# Patient Record
Sex: Female | Born: 1970 | Race: White | Hispanic: No | Marital: Married | Smoking: Former smoker
Health system: Southern US, Community
[De-identification: ages and names within clinical notes are randomized; demographics above are authoritative.]

## PROBLEM LIST (undated history)

## (undated) ENCOUNTER — Inpatient Hospital Stay (HOSPITAL_COMMUNITY): Payer: BC Managed Care – PPO

## (undated) DIAGNOSIS — M1990 Unspecified osteoarthritis, unspecified site: Secondary | ICD-10-CM

## (undated) DIAGNOSIS — R519 Headache, unspecified: Secondary | ICD-10-CM

## (undated) DIAGNOSIS — J45909 Unspecified asthma, uncomplicated: Secondary | ICD-10-CM

## (undated) DIAGNOSIS — Z9889 Other specified postprocedural states: Secondary | ICD-10-CM

## (undated) DIAGNOSIS — R51 Headache: Secondary | ICD-10-CM

## (undated) DIAGNOSIS — T7840XA Allergy, unspecified, initial encounter: Secondary | ICD-10-CM

## (undated) DIAGNOSIS — K219 Gastro-esophageal reflux disease without esophagitis: Secondary | ICD-10-CM

## (undated) DIAGNOSIS — F419 Anxiety disorder, unspecified: Secondary | ICD-10-CM

## (undated) DIAGNOSIS — R112 Nausea with vomiting, unspecified: Secondary | ICD-10-CM

## (undated) HISTORY — PX: CHOLECYSTECTOMY: SHX55

## (undated) HISTORY — PX: OTHER SURGICAL HISTORY: SHX169

## (undated) HISTORY — DX: Allergy, unspecified, initial encounter: T78.40XA

## (undated) HISTORY — DX: Anxiety disorder, unspecified: F41.9

## (undated) HISTORY — PX: KNEE SURGERY: SHX244

## (undated) HISTORY — PX: NOSE SURGERY: SHX723

## (undated) HISTORY — DX: Unspecified osteoarthritis, unspecified site: M19.90

---

## 2010-07-21 ENCOUNTER — Other Ambulatory Visit: Payer: Self-pay | Admitting: Family Medicine

## 2010-07-21 DIAGNOSIS — Z1231 Encounter for screening mammogram for malignant neoplasm of breast: Secondary | ICD-10-CM

## 2010-07-27 ENCOUNTER — Ambulatory Visit
Admission: RE | Admit: 2010-07-27 | Discharge: 2010-07-27 | Disposition: A | Discharge status: Home / Self Care | Payer: Self-pay | Source: Ambulatory Visit | Attending: Family Medicine | Admitting: Family Medicine

## 2010-07-27 DIAGNOSIS — Z1231 Encounter for screening mammogram for malignant neoplasm of breast: Secondary | ICD-10-CM

## 2011-10-05 ENCOUNTER — Ambulatory Visit: Payer: Self-pay | Admitting: Family Medicine

## 2011-10-05 ENCOUNTER — Telehealth: Payer: Self-pay

## 2011-10-05 VITALS — BP 106/71 | HR 76 | Temp 97.7°F | Resp 16 | Ht 67.5 in | Wt 145.0 lb

## 2011-10-05 DIAGNOSIS — E860 Dehydration: Secondary | ICD-10-CM

## 2011-10-05 DIAGNOSIS — R112 Nausea with vomiting, unspecified: Secondary | ICD-10-CM

## 2011-10-05 DIAGNOSIS — R51 Headache: Secondary | ICD-10-CM

## 2011-10-05 DIAGNOSIS — K529 Noninfective gastroenteritis and colitis, unspecified: Secondary | ICD-10-CM

## 2011-10-05 DIAGNOSIS — R509 Fever, unspecified: Secondary | ICD-10-CM

## 2011-10-05 LAB — POCT INFLUENZA A/B
Influenza A, POC: NEGATIVE
Influenza B, POC: NEGATIVE

## 2011-10-05 MED ORDER — ONDANSETRON 4 MG PO TBDP
4.0000 mg | ORAL_TABLET | Freq: Once | ORAL | Status: AC
  Administered 2011-10-05: 4 mg via ORAL

## 2011-10-05 MED ORDER — PROMETHAZINE HCL 25 MG PO TABS: 25.0000 mg | ORAL_TABLET | Freq: Three times a day (TID) | ORAL | Status: DC | PRN

## 2011-10-05 NOTE — Telephone Encounter (Signed)
Advised pt per Marylene Land that she can either take Excedrin or Tylenol

## 2011-10-05 NOTE — Progress Notes (Signed)
[facility]:  Office Visit  Chief Complaint:  Chief Complaint  Patient presents with  . Emesis    yesterday  . Headache  . Generalized Body Aches    HPI: Natalie Yang is a 41 y.o. female who complains of a 1 day h/o msk aches, pain, fever Tmax 100.7, chills, N/V abd pain. Emesis x 8  And wretching with  mild epigastric abd pain. Poor PO intake. Has not eaten anything today. Tried Tylenol with minimal relief. + HA. N/V not associated with food, CP, SOB.  Patient has not tried any new meds, or new travels. She did have these sxs about 12-15 hrs after eating at a salad bar. She has had some minimal diarrhea with the N/V. HEr husband had mild diarrhea but not severe as hers. Patient is also training for a triathlon and has been feeling "not herself" after training but the N/V and generalized fatigue has not been this severe.   History reviewed. No pertinent past medical history. Past Surgical History  Procedure Date  . Knee surgery   . Cholecystectomy    History   Social History  . Marital Status: Single    Spouse Name: N/A    Number of Children: N/A  . Years of Education: N/A   Social History Main Topics  . Smoking status: Former Games developer  . Smokeless tobacco: None  . Alcohol Use: Yes  . Drug Use: No  . Sexually Active: None   Other Topics Concern  . None   Social History Narrative  . None   No family history on file. Allergies  Allergen Reactions  . Codeine Itching and Nausea And Vomiting   Prior to Admission medications   Medication Sig Start Date End Date Taking? Authorizing Provider  butalbital-acetaminophen-caffeine (FIORICET, ESGIC) 50-325-40 MG per tablet Take 1 tablet by mouth 2 (two) times daily as needed.   Yes Historical Provider, MD     ROS: The patient denies fevers, chills, night sweats, unintentional weight loss, chest pain, palpitations, wheezing, dyspnea on exertion, nausea, vomiting, abdominal pain, dysuria, hematuria,  melena, numbness, weakness, or tingling. + nausea/vomiting/mid epigastric pain  All other systems have been reviewed and were otherwise negative with the exception of those mentioned in the HPI and as above.    PHYSICAL EXAM: Filed Vitals:   10/05/11 1226  BP: 106/71  Pulse: 76  Temp: 97.7 F (36.5 C)  Resp: 16   Filed Vitals:   10/05/11 1226  Height: 5' 7.5" (1.715 m)  Weight: 145 lb (65.772 kg)   Body mass index is 22.38 kg/(m^2).  General: Alert, no acute distress, tired appearing HEENT:  Normocephalic, atraumatic, oropharynx patent. TM normal,dry oral mucosa, PERRLA, EOMI Cardiovascular:  Regular rate and rhythm, no rubs murmurs or gallops.  No Carotid bruits, radial pulse intact. No pedal edema.  Respiratory: Clear to auscultation bilaterally.  No wheezes, rales, or rhonchi.  No cyanosis, no use of accessory musculature GI: No organomegaly, abdomen is soft and non-tender, positive bowel sounds.  No masses. Skin: No rashes. Neurologic: Facial musculature symmetric. Psychiatric: Patient is appropriate throughout our interaction. Lymphatic: No cervical lymphadenopathy Musculoskeletal: Gait intact.   LABS: Results for orders placed in visit on 10/05/11  POCT INFLUENZA A/B      Component Value Range   Influenza A, POC Negative     Influenza B, POC Negative       EKG/XRAY:   Primary read interpreted by Dr. Conley Rolls at [facility].   ASSESSMENT/PLAN: Encounter Diagnoses  Name Primary?  . Nausea & vomiting Yes  . Dehydration   . Fever   . Headache   . Gastroenteritis    Patient is uninsured She declined getting more labs or imaging due to cost Flu was negative Patient received IVF and felt better Work note given    Rockne Coons, DO 10/05/2011 2:07 PM

## 2011-10-05 NOTE — Telephone Encounter (Signed)
PATIENT WAS SEEN TODAY BY [facility].  SHE HAS A FLU-LIKE VIRUS AND IS NOW HAVING A MAJOR HEADACHE.  WANTS TO KNOW IF SHE SHOULD TAKE EXCEDRIN OR HER OTHER MEDICATION.  PLEASE CALL

## 2012-10-09 ENCOUNTER — Other Ambulatory Visit: Payer: Self-pay

## 2012-10-09 DIAGNOSIS — Z1231 Encounter for screening mammogram for malignant neoplasm of breast: Secondary | ICD-10-CM

## 2012-10-24 ENCOUNTER — Ambulatory Visit
Admission: RE | Admit: 2012-10-24 | Discharge: 2012-10-24 | Disposition: A | Discharge status: Home / Self Care | Payer: No Typology Code available for payment source | Source: Ambulatory Visit

## 2012-10-24 DIAGNOSIS — Z1231 Encounter for screening mammogram for malignant neoplasm of breast: Secondary | ICD-10-CM

## 2013-09-25 ENCOUNTER — Other Ambulatory Visit: Payer: Self-pay

## 2013-09-25 DIAGNOSIS — Z1231 Encounter for screening mammogram for malignant neoplasm of breast: Secondary | ICD-10-CM

## 2013-10-26 ENCOUNTER — Ambulatory Visit
Admission: RE | Admit: 2013-10-26 | Discharge: 2013-10-26 | Disposition: A | Discharge status: Home / Self Care | Payer: BC Managed Care – PPO | Source: Ambulatory Visit

## 2013-10-26 ENCOUNTER — Encounter (INDEPENDENT_AMBULATORY_CARE_PROVIDER_SITE_OTHER): Payer: Self-pay

## 2013-10-26 DIAGNOSIS — Z1231 Encounter for screening mammogram for malignant neoplasm of breast: Secondary | ICD-10-CM

## 2013-11-01 ENCOUNTER — Ambulatory Visit (INDEPENDENT_AMBULATORY_CARE_PROVIDER_SITE_OTHER): Payer: BC Managed Care – PPO | Admitting: Internal Medicine

## 2013-11-01 VITALS — BP 112/74 | HR 86 | Temp 98.7°F | Resp 16 | Ht 67.0 in | Wt 143.2 lb

## 2013-11-01 DIAGNOSIS — G43909 Migraine, unspecified, not intractable, without status migrainosus: Secondary | ICD-10-CM

## 2013-11-01 DIAGNOSIS — R1110 Vomiting, unspecified: Secondary | ICD-10-CM

## 2013-11-01 DIAGNOSIS — Z8669 Personal history of other diseases of the nervous system and sense organs: Secondary | ICD-10-CM

## 2013-11-01 DIAGNOSIS — E860 Dehydration: Secondary | ICD-10-CM

## 2013-11-01 MED ORDER — KETOROLAC TROMETHAMINE 60 MG/2ML IM SOLN
60.0000 mg | Freq: Once | INTRAMUSCULAR | Status: AC
  Administered 2013-11-01: 60 mg via INTRAMUSCULAR

## 2013-11-01 MED ORDER — ONDANSETRON 4 MG PO TBDP
8.0000 mg | ORAL_TABLET | Freq: Once | ORAL | Status: AC
Start: 2013-11-01 — End: 2013-11-01
  Administered 2013-11-01: 8 mg via ORAL

## 2013-11-01 MED ORDER — ONDANSETRON HCL 8 MG PO TABS: 8.0000 mg | ORAL_TABLET | Freq: Three times a day (TID) | ORAL | Status: DC | PRN

## 2013-11-01 MED ORDER — CYCLOBENZAPRINE HCL 10 MG PO TABS: 10.0000 mg | ORAL_TABLET | Freq: Three times a day (TID) | ORAL | Status: DC | PRN

## 2013-11-01 NOTE — Progress Notes (Signed)
   Subjective:    Patient ID: Regis Billracey Zerbe, female    DOB: 10/10/1970, 43 y.o.   MRN: [id number]  HPI Vomiting, Headache worsening X yesterday morning body aches, Diarrhea Hx of asthma, use an albuterol inhaler Migraines, has sumatryptan and did not work this time No abdominal pain Loss of appetite Took 1 phenergan yesterday, did not help with vomiting.  No focal NMS loss zofran odt 8mg  po now Dehydrated and lite headed. Has had IV fluids here before with success No exposure hx for tick fever Review of Systems Hx neg brain scan in past from [facility]   Had normal cpe and all tests this year from primary care Objective:   Physical Exam  Constitutional: She is oriented to person, place, and time. Vital signs are normal. She appears well-developed and well-nourished. She is cooperative. She has a sickly appearance.  HENT:  Head: Normocephalic.  Right Ear: External ear normal.  Left Ear: External ear normal.  Nose: Nose normal.  Eyes: EOM are normal. Pupils are equal, round, and reactive to light. No scleral icterus.  Neck: Normal range of motion. Neck supple.  Cardiovascular: Normal rate.   Pulmonary/Chest: Effort normal.  Neurological: She is alert and oriented to person, place, and time. She has normal reflexes. She displays normal reflexes. No cranial nerve deficit or sensory deficit. She exhibits normal muscle tone. Coordination normal. She displays no Babinski's sign on the right side. She displays no Babinski's sign on the left side.  Skin: No rash noted.  Psychiatric: She has a normal mood and affect. Her behavior is normal. Judgment and thought content normal.    IV rehydration Toradol 60mg  IM     Assessment & Plan:  Migrain Zofran 8mg /Flexeril 10mg  to sleep/Hydrate Scan prn no improvement/See family doc

## 2013-11-01 NOTE — Patient Instructions (Addendum)
Migraine Headache A migraine headache is an intense, throbbing pain on one or both sides of your head. A migraine can last for 30 minutes to several hours. CAUSES  The exact cause of a migraine headache is not always known. However, a migraine may be caused when nerves in the brain become irritated and release chemicals that cause inflammation. This causes pain. Certain things may also trigger migraines, such as:  Alcohol.  Smoking.  Stress.  Menstruation.  Aged cheeses.  Foods or drinks that contain nitrates, glutamate, aspartame, or tyramine.  Lack of sleep.  Chocolate.  Caffeine.  Hunger.  Physical exertion.  Fatigue.  Medicines used to treat chest pain (nitroglycerine), birth control pills, estrogen, and some blood pressure medicines. SIGNS AND SYMPTOMS  Pain on one or both sides of your head.  Pulsating or throbbing pain.  Severe pain that prevents daily activities.  Pain that is aggravated by any physical activity.  Nausea, vomiting, or both.  Dizziness.  Pain with exposure to bright lights, loud noises, or activity.  General sensitivity to bright lights, loud noises, or smells. Before you get a migraine, you may get warning signs that a migraine is coming (aura). An aura may include:  Seeing flashing lights.  Seeing bright spots, halos, or zig-zag lines.  Having tunnel vision or blurred vision.  Having feelings of numbness or tingling.  Having trouble talking.  Having muscle weakness. DIAGNOSIS  A migraine headache is often diagnosed based on:  Symptoms.  Physical exam.  A CT scan or MRI of your head. These imaging tests cannot diagnose migraines, but they can help rule out other causes of headaches. TREATMENT Medicines may be given for pain and nausea. Medicines can also be given to help prevent recurrent migraines.  HOME CARE INSTRUCTIONS  Only take over-the-counter or prescription medicines for pain or discomfort as directed by your  health care provider. The use of long-term narcotics is not recommended.  Lie down in a dark, quiet room when you have a migraine.  Keep a journal to find out what may trigger your migraine headaches. For example, write down:  What you eat and drink.  How much sleep you get.  Any change to your diet or medicines.  Limit alcohol consumption.  Quit smoking if you smoke.  Get 7 9 hours of sleep, or as recommended by your health care provider.  Limit stress.  Keep lights dim if bright lights bother you and make your migraines worse. SEEK IMMEDIATE MEDICAL CARE IF:   Your migraine becomes severe.  You have a fever.  You have a stiff neck.  You have vision loss.  You have muscular weakness or loss of muscle control.  You start losing your balance or have trouble walking.  You feel faint or pass out.  You have severe symptoms that are different from your first symptoms. MAKE SURE YOU:   Understand these instructions.  Will watch your condition.  Will get help right away if you are not doing well or get worse. Document Released: 05/31/2005 Document Revised: 03/21/2013 Document Reviewed: 02/05/2013 [facility] Patient Information 2014 [place], [place]. [place] Spotted Fever [place] Spotted Fever (RMSF) is the oldest known tick-borne disease of people in the [place]. This disease was named because it was first described among people in the [facility] area who had an illness characterized by a rash with red-purple-black spots. This disease is caused by a rickettsia (Rickettsia rickettsii), a bacteria carried by the tick. The [facility] wood tick  and the American dog tick, acquire and transmit the RMSF bacteria (pictures NOT actual size). When a larval, nymphal or adult tick feeds on an infected rodent or larger animal, the tick can become infected. Infected adult ticks then feed on people who may then get RMSF. The tick transmits the disease to humans  during a prolonged period of feeding that lasts many hours, days or even a couple weeks. The bite is painless and frequently goes unnoticed. An infected female tick may also pass the rickettsial bacteria to her eggs that then may mature to be infected adult ticks. The rickettsia that causes RMSF can also get into a person's body through damaged skin. A tick bite is not necessary. People can get RMSF if they crush a tick and get it's blood or body fluids on their skin through a small cut or sore.  DIAGNOSIS Diagnosis is made by laboratory tests.  TREATMENT Treatment is with antibiotics (medications that kill rickettsia and other bacteria). Immediate treatment usually prevents death. GEOGRAPHIC RANGE This disease was reported only in the [place] until 1931. RMSF has more recently been described among individuals in all states except [place], [place] and [place]. The highest reported incidences of RMSF now occur among residents of [place], [place], [place] and [address] [place]. TIME OF YEAR  Most cases are diagnosed during late spring and summer when ticks are most active. However, especially in the warmer [place], a few cases occur during the winter. SYMPTOMS   Symptoms of RMSF begin from 2 to 14 days after a tick bite. The most common early symptoms are fever, muscle aches and headache followed by nausea (feeling sick to your stomach) or vomiting.  The RMSF rash is typically delayed until 3 or more days after symptom onset, and eventually develops in 9 of 10 infected patients by the 5th day of illness. If the disease is not treated it can cause death. If you get a fever, headache, muscle aches, rash, nausea or vomiting within 2 weeks of a possible tick bite or exposure you should see your caregiver immediately. PREVENTION Ticks prefer to hide in shady, moist ground litter. They can often be found above the ground clinging to tall grass, brush, shrubs and low tree branches. They also  inhabit lawns and gardens, especially at the edges of woodlands and around old stone walls. Within the areas where ticks generally live, no naturally vegetated area can be considered completely free of infected ticks. The best precaution against RMSF is to avoid contact with soil, leaf litter and vegetation as much as possible in tick infested areas. For those who enjoy gardening or walking in their yards, clear brush and mow tall grass around houses and at the edges of gardens. This may help reduce the tick population in the immediate area. Applications of chemical insecticides by a licensed professional in the spring (late May) and Fall (September) will also control ticks, especially in heavily infested areas. Treatment will never get rid of all the ticks. Getting rid of small animal populations that host ticks will also decrease the tick population. When working in the garden, Mattelpruning shrubs, or handling soil and vegetation, wear light-colored protective clothing and gloves. Spot-check often to prevent ticks from reaching the skin. Ticks cannot jump or fly. They will not drop from an above-ground perch onto a passing animal. Once a tick gains access to human skin it climbs upward until it reaches a more protected area. For example, the back of the knee, groin, navel, armpit,  ears or nape of the neck. It then begins the slow process of embedding itself in the skin. Campers, hikers, field workers, and others who spend time in wooded, brushy or tall grassy areas can avoid exposure to ticks by using the following precautions:  Wear light-colored clothing with a tight weave to spot ticks more easily and prevent contact with the skin.  Wear long pants tucked into socks, long-sleeved shirts tucked into pants and enclosed shoes or boots along with insect repellent.  Spray clothes with insect repellent containing either DEET or Permethrin. Only DEET can be used on exposed skin. Follow the manufacturer's directions  carefully.  Wear a hat and keep long hair pulled back.  Stay on cleared, well-worn trails whenever possible.  Spot-check yourself and others often for the presence of ticks on clothes. If you find one, there are likely to be others. Check thoroughly.  Remove clothes after leaving tick-infested areas. If possible, wash them to eliminate any unseen ticks. Check yourself, your children and any pets from head to toe for the presence of ticks.  Shower and shampoo. You can greatly reduce your chances of contracting RMSF if you remove attached ticks as soon as possible. Regular checks of the body, including all body sites covered by hair (head, armpits, genitals), allow removal of the tick before rickettsial transmission. To remove an attached tick, use a forceps or tweezers to detach the intact tick without leaving mouth parts in the skin. The tick bite wound should be cleansed after tick removal. Remember the most common symptoms of RMSF are fever, muscle aches, headache and nausea or vomiting with a later onset of rash. If you get these symptoms after a tick bite and while living in an area where RMSF is found, RMSF should be suspected. If the disease is not treated, it can cause death. See your caregiver immediately if you get these symptoms. Do this even if not aware of a tick bite. Document Released: 09/12/2000 Document Revised: 08/23/2011 Document Reviewed: 05/05/2009 [facility] Patient Information 2014 [place].

## 2014-08-23 ENCOUNTER — Encounter (HOSPITAL_COMMUNITY): Payer: Self-pay

## 2014-08-23 ENCOUNTER — Encounter (HOSPITAL_COMMUNITY)
Admission: RE | Admit: 2014-08-23 | Discharge: 2014-08-23 | Disposition: A | Discharge status: Home / Self Care | Payer: BLUE CROSS/BLUE SHIELD | Source: Ambulatory Visit | Attending: Obstetrics and Gynecology | Admitting: Obstetrics and Gynecology

## 2014-08-23 DIAGNOSIS — N393 Stress incontinence (female) (male): Secondary | ICD-10-CM | POA: Insufficient documentation

## 2014-08-23 DIAGNOSIS — Z975 Presence of (intrauterine) contraceptive device: Secondary | ICD-10-CM | POA: Diagnosis not present

## 2014-08-23 DIAGNOSIS — Z01812 Encounter for preprocedural laboratory examination: Secondary | ICD-10-CM | POA: Diagnosis not present

## 2014-08-23 HISTORY — DX: Headache: R51

## 2014-08-23 HISTORY — DX: Headache, unspecified: R51.9

## 2014-08-23 HISTORY — DX: Unspecified asthma, uncomplicated: J45.909

## 2014-08-23 HISTORY — DX: Gastro-esophageal reflux disease without esophagitis: K21.9

## 2014-08-23 HISTORY — DX: Other specified postprocedural states: Z98.890

## 2014-08-23 HISTORY — DX: Other specified postprocedural states: R11.2

## 2014-08-23 LAB — CBC
HEMATOCRIT: 40.4 % (ref 36.0–46.0)
HEMOGLOBIN: 13.2 g/dL (ref 12.0–15.0)
MCH: 32.4 pg (ref 26.0–34.0)
MCHC: 32.7 g/dL (ref 30.0–36.0)
MCV: 99.3 fL (ref 78.0–100.0)
Platelets: 278 10*3/uL (ref 150–400)
RBC: 4.07 MIL/uL (ref 3.87–5.11)
RDW: 12.9 % (ref 11.5–15.5)
WBC: 4.7 10*3/uL (ref 4.0–10.5)

## 2014-08-23 NOTE — Patient Instructions (Signed)
Your procedure is scheduled on:08/30/14  Enter through the Main Entrance at :6am Pick up desk phone and dial [phone] and inform us of your arrival.  Please call [phone] if you have any problems the morning of surgery.  Remember: Do not eat food or drink liquids, including water, after midnight:Thursday   BRING INHALER TO HOSPITAL  You may brush your teeth the morning of surgery.   DO NOT wear jewelry, eye make-up, lipstick,body lotion, or dark fingernail polish.  (Polished toes are ok) You may wear deodorant.  If you are to be admitted after surgery, leave suitcase in car until your room has been assigned. Patients discharged on the day of surgery will not be allowed to drive home. Wear loose fitting, comfortable clothes for your ride home.

## 2014-08-29 NOTE — H&P (Signed)
Natalie Yang is an 44 y.o. female.  She was seen foa an annual exam in December, was having irregular menses every 1-2 months, some mood issues-Prozac helps a little, no significant hot flashes, still has Paragard. Sexually active, slightly decreased libido-[facility]. She is having increased urine leak with strain, limits activities. She would like to get paragard out and see if that helps with back pain, interested in permanent sterility and endometrial ablation.  Normal pelvic ultrasound and benign pipelle.  Pertinent Gynecological History: Last pap: abnormal: ASCUS with neg HPV Date: 05/2014 OB History: G4, P1021 SVD at term without problems   Menstrual History: Irregular menses every 1-2 months    Past Medical History  Diagnosis Date  . PONV (postoperative nausea and vomiting)   . GERD (gastroesophageal reflux disease)   . Headache     migraines  . Asthma     Past Surgical History  Procedure Laterality Date  . Knee surgery    . Cholecystectomy    . Nose surgery      x 5    No family history on file.  Social History:  reports that she has quit smoking. She does not have any smokeless tobacco history on file. She reports that she drinks alcohol. She reports that she does not use illicit drugs.  Allergies:  Allergies  Allergen Reactions  . Codeine Itching and Nausea And Vomiting    No prescriptions prior to admission    Review of Systems  Respiratory: Negative.   Cardiovascular: Negative.   Gastrointestinal: Negative.   Genitourinary: Negative.     There were no vitals taken for this visit. Physical Exam  Constitutional: She appears well-developed and well-nourished.  Neck: Neck supple. No thyromegaly present.  Cardiovascular: Normal rate, regular rhythm and normal heart sounds.   No murmur heard. Respiratory: Effort normal and breath sounds normal. No respiratory distress. She has no wheezes.  GI: Soft. She exhibits no distension  and no mass. There is no tenderness.  Genitourinary: Vagina normal.  Uterus normal size No adnexal mass Urethra is hyermobile    No results found for this or any previous visit (from the past 24 hour(s)).  No results found.  Assessment/Plan: Desires surgical sterility and removal of Paragard, has menorrhagia and SUI.  Discussed all medical and surgical options, surgical procedures and risks including specific risks associated with mesh.  Will admit for laparoscopic bilateral salpingectomy, IUD removal, Novasure and Solyx sling.  Jamus Loving D 08/29/2014, 7:37 PM

## 2014-08-30 ENCOUNTER — Encounter (HOSPITAL_COMMUNITY)
Admission: RE | Disposition: A | Discharge status: Home / Self Care | Payer: Self-pay | Source: Ambulatory Visit | Attending: Obstetrics and Gynecology

## 2014-08-30 ENCOUNTER — Ambulatory Visit (HOSPITAL_COMMUNITY): Payer: BLUE CROSS/BLUE SHIELD | Admitting: Certified Registered Nurse Anesthetist

## 2014-08-30 ENCOUNTER — Encounter (HOSPITAL_COMMUNITY): Payer: Self-pay | Admitting: *Deleted

## 2014-08-30 ENCOUNTER — Ambulatory Visit (HOSPITAL_COMMUNITY)
Admission: RE | Admit: 2014-08-30 | Discharge: 2014-08-30 | Disposition: A | Discharge status: Home / Self Care | Payer: BLUE CROSS/BLUE SHIELD | Source: Ambulatory Visit | Attending: Obstetrics and Gynecology | Admitting: Obstetrics and Gynecology

## 2014-08-30 DIAGNOSIS — G43909 Migraine, unspecified, not intractable, without status migrainosus: Secondary | ICD-10-CM | POA: Diagnosis not present

## 2014-08-30 DIAGNOSIS — N393 Stress incontinence (female) (male): Secondary | ICD-10-CM | POA: Insufficient documentation

## 2014-08-30 DIAGNOSIS — Z30432 Encounter for removal of intrauterine contraceptive device: Secondary | ICD-10-CM | POA: Insufficient documentation

## 2014-08-30 DIAGNOSIS — N920 Excessive and frequent menstruation with regular cycle: Secondary | ICD-10-CM | POA: Diagnosis not present

## 2014-08-30 DIAGNOSIS — Z87891 Personal history of nicotine dependence: Secondary | ICD-10-CM | POA: Diagnosis not present

## 2014-08-30 DIAGNOSIS — K219 Gastro-esophageal reflux disease without esophagitis: Secondary | ICD-10-CM | POA: Insufficient documentation

## 2014-08-30 DIAGNOSIS — J45909 Unspecified asthma, uncomplicated: Secondary | ICD-10-CM | POA: Diagnosis not present

## 2014-08-30 DIAGNOSIS — Z302 Encounter for sterilization: Secondary | ICD-10-CM | POA: Diagnosis not present

## 2014-08-30 HISTORY — PX: CYSTOSCOPY: SHX5120

## 2014-08-30 HISTORY — PX: HYSTEROSCOPY WITH NOVASURE: SHX5574

## 2014-08-30 HISTORY — PX: LAPAROSCOPIC TUBAL LIGATION: SHX1937

## 2014-08-30 HISTORY — PX: BLADDER SUSPENSION: SHX72

## 2014-08-30 HISTORY — PX: IUD REMOVAL: SHX5392

## 2014-08-30 LAB — PREGNANCY, URINE: PREG TEST UR: NEGATIVE

## 2014-08-30 SURGERY — HYSTEROSCOPY WITH NOVASURE
Anesthesia: General | Site: Vagina

## 2014-08-30 MED ORDER — LIDOCAINE HCL (CARDIAC) 20 MG/ML IV SOLN
INTRAVENOUS | Status: DC | PRN
  Administered 2014-08-30: 80 mg via INTRAVENOUS

## 2014-08-30 MED ORDER — NEOSTIGMINE METHYLSULFATE 10 MG/10ML IV SOLN
INTRAVENOUS | Status: DC | PRN
  Administered 2014-08-30: 3 mg via INTRAVENOUS

## 2014-08-30 MED ORDER — BUPIVACAINE-EPINEPHRINE 0.5% -1:200000 IJ SOLN
INTRAMUSCULAR | Status: DC | PRN
  Administered 2014-08-30: 10 mL

## 2014-08-30 MED ORDER — 0.9 % SODIUM CHLORIDE (POUR BTL) OPTIME
TOPICAL | Status: DC | PRN
  Administered 2014-08-30: 1000 mL

## 2014-08-30 MED ORDER — HYDROCODONE-ACETAMINOPHEN 5-325 MG PO TABS: 1.0000 | ORAL_TABLET | Freq: Four times a day (QID) | ORAL | Status: DC | PRN

## 2014-08-30 MED ORDER — GLYCOPYRROLATE 0.2 MG/ML IJ SOLN
INTRAMUSCULAR | Status: AC
  Filled 2014-08-30: qty 1

## 2014-08-30 MED ORDER — ROCURONIUM BROMIDE 100 MG/10ML IV SOLN
INTRAVENOUS | Status: DC | PRN
  Administered 2014-08-30: 40 mg via INTRAVENOUS

## 2014-08-30 MED ORDER — MIDAZOLAM HCL 2 MG/2ML IJ SOLN
INTRAMUSCULAR | Status: AC
  Filled 2014-08-30: qty 2

## 2014-08-30 MED ORDER — BUPIVACAINE HCL (PF) 0.25 % IJ SOLN
INTRAMUSCULAR | Status: AC
  Filled 2014-08-30: qty 30

## 2014-08-30 MED ORDER — MIDAZOLAM HCL 2 MG/2ML IJ SOLN
INTRAMUSCULAR | Status: DC | PRN
  Administered 2014-08-30: 2 mg via INTRAVENOUS

## 2014-08-30 MED ORDER — ROCURONIUM BROMIDE 100 MG/10ML IV SOLN
INTRAVENOUS | Status: AC
  Filled 2014-08-30: qty 1

## 2014-08-30 MED ORDER — HYDROMORPHONE HCL 1 MG/ML IJ SOLN
INTRAMUSCULAR | Status: AC
  Administered 2014-08-30: 0.5 mg via INTRAVENOUS
  Filled 2014-08-30: qty 1

## 2014-08-30 MED ORDER — GLYCOPYRROLATE 0.2 MG/ML IJ SOLN
INTRAMUSCULAR | Status: DC | PRN
  Administered 2014-08-30: 0.1 mg via INTRAVENOUS
  Administered 2014-08-30: 0.6 mg via INTRAVENOUS

## 2014-08-30 MED ORDER — DEXAMETHASONE SODIUM PHOSPHATE 4 MG/ML IJ SOLN
INTRAMUSCULAR | Status: AC
  Filled 2014-08-30: qty 1

## 2014-08-30 MED ORDER — HYDROCODONE-ACETAMINOPHEN 5-325 MG PO TABS
ORAL_TABLET | ORAL | Status: AC
  Filled 2014-08-30: qty 1

## 2014-08-30 MED ORDER — LIDOCAINE HCL 1 % IJ SOLN
INTRAMUSCULAR | Status: AC
  Filled 2014-08-30: qty 20

## 2014-08-30 MED ORDER — ACETAMINOPHEN 160 MG/5ML PO SOLN: 975.0000 mg | Freq: Once | ORAL | Status: DC

## 2014-08-30 MED ORDER — LIDOCAINE HCL 2 % IJ SOLN
INTRAMUSCULAR | Status: DC | PRN
  Administered 2014-08-30: 16 mL

## 2014-08-30 MED ORDER — LIDOCAINE HCL 2 % IJ SOLN
INTRAMUSCULAR | Status: AC
  Filled 2014-08-30: qty 20

## 2014-08-30 MED ORDER — PROPOFOL 10 MG/ML IV BOLUS
INTRAVENOUS | Status: AC
  Filled 2014-08-30: qty 20

## 2014-08-30 MED ORDER — ESTRADIOL 0.1 MG/GM VA CREA
TOPICAL_CREAM | VAGINAL | Status: AC
  Filled 2014-08-30: qty 42.5

## 2014-08-30 MED ORDER — DEXAMETHASONE SODIUM PHOSPHATE 10 MG/ML IJ SOLN
INTRAMUSCULAR | Status: DC | PRN
  Administered 2014-08-30: 4 mg via INTRAVENOUS

## 2014-08-30 MED ORDER — HYDROCODONE-ACETAMINOPHEN 5-325 MG PO TABS
1.0000 | ORAL_TABLET | Freq: Once | ORAL | Status: AC | PRN
  Administered 2014-08-30: 1 via ORAL

## 2014-08-30 MED ORDER — LACTATED RINGERS IV SOLN
INTRAVENOUS | Status: DC
  Administered 2014-08-30 (×2): via INTRAVENOUS

## 2014-08-30 MED ORDER — KETOROLAC TROMETHAMINE 30 MG/ML IJ SOLN
INTRAMUSCULAR | Status: AC
  Filled 2014-08-30: qty 1

## 2014-08-30 MED ORDER — LACTATED RINGERS IR SOLN
Status: DC | PRN
  Administered 2014-08-30: 3000 mL

## 2014-08-30 MED ORDER — CEFAZOLIN SODIUM-DEXTROSE 2-3 GM-% IV SOLR
INTRAVENOUS | Status: DC | PRN
  Administered 2014-08-30: 2 g via INTRAVENOUS

## 2014-08-30 MED ORDER — HYDROMORPHONE HCL 1 MG/ML IJ SOLN
0.2500 mg | INTRAMUSCULAR | Status: DC | PRN
  Administered 2014-08-30 (×2): 0.5 mg via INTRAVENOUS

## 2014-08-30 MED ORDER — FENTANYL CITRATE 0.05 MG/ML IJ SOLN
INTRAMUSCULAR | Status: DC | PRN
  Administered 2014-08-30 (×2): 50 ug via INTRAVENOUS
  Administered 2014-08-30: 100 ug via INTRAVENOUS
  Administered 2014-08-30: 50 ug via INTRAVENOUS

## 2014-08-30 MED ORDER — STERILE WATER FOR IRRIGATION IR SOLN
Status: DC | PRN
  Administered 2014-08-30: 1000 mL via INTRAVESICAL

## 2014-08-30 MED ORDER — KETOROLAC TROMETHAMINE 30 MG/ML IJ SOLN
INTRAMUSCULAR | Status: DC | PRN
  Administered 2014-08-30: 30 mg via INTRAVENOUS

## 2014-08-30 MED ORDER — BUPIVACAINE-EPINEPHRINE (PF) 0.5% -1:200000 IJ SOLN
INTRAMUSCULAR | Status: AC
  Filled 2014-08-30: qty 30

## 2014-08-30 MED ORDER — NEOSTIGMINE METHYLSULFATE 10 MG/10ML IV SOLN
INTRAVENOUS | Status: AC
  Filled 2014-08-30: qty 1

## 2014-08-30 MED ORDER — ONDANSETRON HCL 4 MG/2ML IJ SOLN
INTRAMUSCULAR | Status: DC | PRN
Start: 2014-08-30 — End: 2014-08-30
  Administered 2014-08-30: 4 mg via INTRAVENOUS

## 2014-08-30 MED ORDER — CEFAZOLIN SODIUM-DEXTROSE 2-3 GM-% IV SOLR
INTRAVENOUS | Status: AC
  Filled 2014-08-30: qty 50

## 2014-08-30 MED ORDER — FENTANYL CITRATE 0.05 MG/ML IJ SOLN
INTRAMUSCULAR | Status: AC
  Filled 2014-08-30: qty 5

## 2014-08-30 MED ORDER — SCOPOLAMINE 1 MG/3DAYS TD PT72
1.0000 | MEDICATED_PATCH | Freq: Once | TRANSDERMAL | Status: DC
  Administered 2014-08-30: 1.5 mg via TRANSDERMAL

## 2014-08-30 MED ORDER — SCOPOLAMINE 1 MG/3DAYS TD PT72
MEDICATED_PATCH | TRANSDERMAL | Status: AC
  Administered 2014-08-30: 1.5 mg via TRANSDERMAL
  Filled 2014-08-30: qty 1

## 2014-08-30 MED ORDER — GLYCOPYRROLATE 0.2 MG/ML IJ SOLN
INTRAMUSCULAR | Status: AC
  Filled 2014-08-30: qty 3

## 2014-08-30 MED ORDER — ONDANSETRON HCL 4 MG/2ML IJ SOLN
INTRAMUSCULAR | Status: AC
  Filled 2014-08-30: qty 2

## 2014-08-30 MED ORDER — LIDOCAINE HCL (CARDIAC) 20 MG/ML IV SOLN
INTRAVENOUS | Status: AC
  Filled 2014-08-30: qty 5

## 2014-08-30 MED ORDER — PROPOFOL 10 MG/ML IV BOLUS
INTRAVENOUS | Status: DC | PRN
  Administered 2014-08-30: 150 mg via INTRAVENOUS

## 2014-08-30 MED ORDER — BUPIVACAINE HCL (PF) 0.25 % IJ SOLN
INTRAMUSCULAR | Status: DC | PRN
  Administered 2014-08-30: 9 mL

## 2014-08-30 SURGICAL SUPPLY — 53 items
ABLATOR ENDOMETRIAL BIPOLAR (ABLATOR) ×6 IMPLANT
BLADE SURG 15 STRL LF C SS BP (BLADE) ×4 IMPLANT
BLADE SURG 15 STRL SS (BLADE) ×12
CANISTER SUCT 3000ML (MISCELLANEOUS) ×6 IMPLANT
CATH ROBINSON RED A/P 16FR (CATHETERS) ×6 IMPLANT
CHLORAPREP W/TINT 26ML (MISCELLANEOUS) ×6 IMPLANT
CLOTH BEACON ORANGE TIMEOUT ST (SAFETY) ×6 IMPLANT
CONTAINER PREFILL 10% NBF 60ML (FORM) ×12 IMPLANT
DECANTER SPIKE VIAL GLASS SM (MISCELLANEOUS) ×6 IMPLANT
DRAPE HYSTEROSCOPY (DRAPE) ×6 IMPLANT
DRSG COVADERM PLUS 2X2 (GAUZE/BANDAGES/DRESSINGS) ×8 IMPLANT
DRSG OPSITE POSTOP 3X4 (GAUZE/BANDAGES/DRESSINGS) ×2 IMPLANT
DRSG TELFA 3X8 NADH (GAUZE/BANDAGES/DRESSINGS) IMPLANT
GAUZE PACKING 2X5 YD STRL (GAUZE/BANDAGES/DRESSINGS) IMPLANT
GLOVE BIO SURGEON STRL SZ 6.5 (GLOVE) ×1 IMPLANT
GLOVE BIO SURGEON STRL SZ8 (GLOVE) ×6 IMPLANT
GLOVE BIO SURGEONS STRL SZ 6.5 (GLOVE) ×1
GLOVE BIOGEL PI IND STRL 7.0 (GLOVE) IMPLANT
GLOVE BIOGEL PI INDICATOR 7.0 (GLOVE) ×4
GLOVE ORTHO TXT STRL SZ7.5 (GLOVE) ×12 IMPLANT
GLOVE SURG SS PI 7.0 STRL IVOR (GLOVE) ×4 IMPLANT
GOWN STRL REUS W/TWL LRG LVL3 (GOWN DISPOSABLE) ×14 IMPLANT
LIQUID BAND (GAUZE/BANDAGES/DRESSINGS) ×6 IMPLANT
NDL SPNL 22GX3.5 QUINCKE BK (NEEDLE) ×4 IMPLANT
NEEDLE HYPO 22GX1.5 SAFETY (NEEDLE) ×10 IMPLANT
NEEDLE INSUFFLATION 120MM (ENDOMECHANICALS) ×6 IMPLANT
NEEDLE SPNL 22GX3.5 QUINCKE BK (NEEDLE) ×6 IMPLANT
NS IRRIG 1000ML POUR BTL (IV SOLUTION) ×6 IMPLANT
PACK LAPAROSCOPY BASIN (CUSTOM PROCEDURE TRAY) ×6 IMPLANT
PACK VAGINAL MINOR WOMEN LF (CUSTOM PROCEDURE TRAY) ×4 IMPLANT
PACK VAGINAL WOMENS (CUSTOM PROCEDURE TRAY) ×6 IMPLANT
PAD DRESSING TELFA 3X8 NADH (GAUZE/BANDAGES/DRESSINGS) ×4 IMPLANT
PAD OB MATERNITY 4.3X12.25 (PERSONAL CARE ITEMS) ×6 IMPLANT
PAD POSITIONER PINK NONSTERILE (MISCELLANEOUS) ×6 IMPLANT
PAD PREP 24X48 CUFFED NSTRL (MISCELLANEOUS) ×6 IMPLANT
SET CYSTO W/LG BORE CLAMP LF (SET/KITS/TRAYS/PACK) ×6 IMPLANT
SLING SOLYX SYSTEM SIS BX (SLING) IMPLANT
SLING SOLYX SYSTEM SIS EA (Sling) ×2 IMPLANT
SUT VIC AB 2-0 CT1 (SUTURE) ×24 IMPLANT
SUT VIC AB 2-0 CT2 27 (SUTURE) ×2 IMPLANT
SUT VIC AB 3-0 CTX 36 (SUTURE) IMPLANT
SUT VIC AB 3-0 PS2 18 (SUTURE) ×6
SUT VIC AB 3-0 PS2 18XBRD (SUTURE) ×4 IMPLANT
SUT VICRYL 0 UR6 27IN ABS (SUTURE) ×2 IMPLANT
SUT VICRYL 4-0 PS2 18IN ABS (SUTURE) ×2 IMPLANT
SYRINGE 20CC LL (MISCELLANEOUS) ×2 IMPLANT
TOWEL OR 17X24 6PK STRL BLUE (TOWEL DISPOSABLE) ×12 IMPLANT
TRAY FOLEY CATH 14FR (SET/KITS/TRAYS/PACK) ×6 IMPLANT
TROCAR XCEL NON-BLD 11X100MML (ENDOMECHANICALS) ×6 IMPLANT
TROCAR XCEL NON-BLD 5MMX100MML (ENDOMECHANICALS) ×6 IMPLANT
TUBING AQUILEX INFLOW (TUBING) ×6 IMPLANT
WARMER LAPAROSCOPE (MISCELLANEOUS) ×6 IMPLANT
WATER STERILE IRR 1000ML POUR (IV SOLUTION) ×6 IMPLANT

## 2014-08-30 NOTE — Anesthesia Procedure Notes (Signed)
Procedure Name: Intubation Date/Time: 08/30/2014 7:29 AM Performed by: Elgie CongoMALINOVA, Lannette Avellino H Pre-anesthesia Checklist: Patient identified, Emergency Drugs available, Suction available and Patient being monitored Patient Re-evaluated:Patient Re-evaluated prior to inductionOxygen Delivery Method: Circle system utilized Preoxygenation: Pre-oxygenation with 100% oxygen Intubation Type: IV induction Ventilation: Mask ventilation without difficulty Laryngoscope Size: Mac and 3 Grade View: Grade I Tube type: Oral Tube size: 7.0 mm Number of attempts: 1 Airway Equipment and Method: Stylet Placement Confirmation: ETT inserted through vocal cords under direct vision,  positive ETCO2 and breath sounds checked- equal and bilateral Secured at: 20 cm Tube secured with: Tape Dental Injury: Teeth and Oropharynx as per pre-operative assessment

## 2014-08-30 NOTE — Anesthesia Preprocedure Evaluation (Signed)
Anesthesia Evaluation  Patient identified by MRN, date of birth, ID band Patient awake    Reviewed: Allergy & Precautions, H&P , Patient's Chart, lab work & pertinent test results, reviewed documented beta blocker date and time   History of Anesthesia Complications (+) PONV and history of anesthetic complications  Airway Mallampati: II  TM Distance: >3 FB Neck ROM: full    Dental   Pulmonary asthma , former smoker,  breath sounds clear to auscultation        Cardiovascular Exercise Tolerance: Good Rhythm:regular Rate:Normal     Neuro/Psych    GI/Hepatic GERD-  Controlled,  Endo/Other    Renal/GU      Musculoskeletal   Abdominal   Peds  Hematology   Anesthesia Other Findings   Reproductive/Obstetrics                             Anesthesia Physical Anesthesia Plan  ASA: II  Anesthesia Plan: General ETT   Post-op Pain Management:    Induction:   Airway Management Planned:   Additional Equipment:   Intra-op Plan:   Post-operative Plan:   Informed Consent: I have reviewed the patients History and Physical, chart, labs and discussed the procedure including the risks, benefits and alternatives for the proposed anesthesia with the patient or authorized representative who has indicated his/her understanding and acceptance.   Dental Advisory Given  Plan Discussed with: CRNA and Surgeon  Anesthesia Plan Comments:         Anesthesia Quick Evaluation

## 2014-08-30 NOTE — Op Note (Signed)
Preoperative diagnosis: Desires surgical sterility, menorrhagia, SUI Postoperative diagnosis: Same Procedure: Laparoscopic bilateral salpingectomy, removal of Paragard IUD, hysteroscopy and Novasure, Solyx sling Surgeon: Lavina Hamman M.D. Anesthesia: Gen. Endotracheal tube Findings: She had a normal abdomen and pelvis with normal uterus tubes and ovaries, normal endometrial cavity, normal bladder and ureters.  The Novasure used a cavity length of 5 cm, width of 2.7 cm, used 74 watts for 71 seconds Specimens: Bilateral fallopian tubes Estimated blood loss: Minimal Complications: None  Procedure in detail  The patient was taken to the operating room and placed in the dorsosupine position. General anesthesia was induced. Her legs were placed in mobile stirrups and both arms were tucked to her side. Abdomen perineum and vagina were then prepped and draped in the usual sterile fashion, a Foley catheter was inserted, a Hulka tenaculum was applied to the cervix for uterine manipulation. Infraumbilical skin was then infiltrated with quarter percent Marcaine and a 1 cm vertical incision was made. The veress needle was inserted into the peritoneal cavity and placement confirmed by the water drop test and an opening pressure of 6 mm of mercury. CO2 was insufflated to a pressure of 12 mm of mercury and the veress needle was removed. A 10/11 disposable trocar was then introduced with direct visualization with the laparoscope. A 5 mm port was then placed low in the midline also under direct visualization. Inspection revealed the above-mentioned findings with normal anatomy. The distal end of each tube was grasped and elevated. Using bipolar cautery I was able to free the distal end of each tube from the ovary, fulgurate the mesosalpinx and fulgurate across a proxiamal portion of the fallopian tube. Scissors were then used to remove the fallopian tube. A small amount of bleeding from the right side was controlled  with bipolar cautery. This is done bilaterally without difficulty. Both segments of tube were removed through the umbilical trocar. The 5 mm port was removed under direct visualization. All gas was allowed to deflate from the abdomen and the umbilical trocar was removed. One figure-of-eight suture of 0 Vicryl was placed in the umbilical incision. Skin incisions were then closed with interrupted subcuticular sutures of 4-0 Vicryl followed by Liqui-band.  Attention was now turned vaginally.  The legs were elevated in the stirrups and the foley catheter was removed. A Graves speculum was inserted into the vagina and the anterior lip of the cervix was grasped with a single-tooth tenaculum. The paracervical block was then performed with a total of 16 cc 2% lidocaine. Uterus then sounded to 8 cm. Cervix was easily dilated to size 23 dilator. The observer hysteroscope was inserted and good visualization was achieved using lactated Ringer's. The endometrial cavity was normal with no fibroids or significant polyps. The hysteroscope was removed. The cervix was further dilated to a size 7 and size 8 Hegar dilator measuring the cervix a 3 cm. The NovaSure device was inserted and deployed properly. The CO2 test passed. Endometrial ablation was performed with the above-mentioned settings without difficulty. The device was then allowed to cool for about 30 seconds and was removed. Hysteroscopy was then performed which revealed good global endometrial ablation and still no lesions. Hysteroscope and fluid were then removed.  The graves speculum was removed and a right angle retractor was placed in the posterior vagina.  The anterior vagina was grasped with Allis clamps proximally 1 1/2 fingerbreadths from the urethral meatus. Local anesthetic with half percent Marcaine with epi was infiltrated in the midline and bilaterally for  hydrodissection. A 1 cm vertical incision was was then made in the vagina between the Allis clamps.  The edges of the incision were then grasped with the Allis clamps. Metzenbaum scissors were used to sharply dissect the vaginal mucosa to each pubic ramus. The Solyx sling was then first placed on the patient's right side and anchored behind the pubic bone.  A good placement was achieved on the right side. Placement was achieved on the left side in a similar fashion submucosal to just past the pubic ramus. A right angle clamp was able to just barely be passed between the sling and the urethral tissue confirming good tension. Cystoscopy was performed which revealed a normal bladder and no evidence of injury to the bladder or the urethra. 200 cc of fluid was used for the cystoscopy. The cystoscope was removed. A Cred maneuver was performed and no leakage was seen. The Solyx sling was released on the left side. The vaginal incision was then closed with running locking 2-0 Vicryl with adequate closure and adequate hemostasis. All instruments were then removed from the vagina. The patient tolerated the procedure well and was taken to the recovery room in stable condition. Counts were correct, she received Ancef 2 gm IV at the beginning of the procedure, she had PAS hose on throughout the procedure.

## 2014-08-30 NOTE — Transfer of Care (Signed)
Immediate Anesthesia Transfer of Care Note  Patient: Natalie Yang  Procedure(s) Performed: Procedure(s) with comments: HYSTEROSCOPY WITH NOVASURE (N/A) - Dr only needs 1 1/2hrs OR time INTRAUTERINE DEVICE (IUD) REMOVAL (N/A) LAPAROSCOPIC TUBAL LIGATION (Bilateral) TRANSVAGINAL TAPE (TVT) PROCEDURE (N/A) CYSTOSCOPY (N/A)  Patient Location: PACU  Anesthesia Type:General  Level of Consciousness: awake, alert  and oriented  Airway & Oxygen Therapy: Patient Spontanous Breathing and Patient connected to nasal cannula oxygen  Post-op Assessment: Report given to RN, Post -op Vital signs reviewed and stable and Patient moving all extremities  Post vital signs: Reviewed and stable  Last Vitals:  Filed Vitals:   08/30/14 0616  BP: 114/74  Pulse: 65  Temp: 36.4 C  Resp: 18    Complications: No apparent anesthesia complications

## 2014-08-30 NOTE — Anesthesia Postprocedure Evaluation (Signed)
  Anesthesia Post-op Note  Patient: Natalie Yang  Procedure(s) Performed: Procedure(s) with comments: HYSTEROSCOPY WITH NOVASURE (N/A) - Dr only needs 1 1/2hrs OR time INTRAUTERINE DEVICE (IUD) REMOVAL (N/A) LAPAROSCOPIC TUBAL LIGATION (Bilateral) TRANSVAGINAL TAPE (TVT) PROCEDURE (N/A) CYSTOSCOPY (N/A) Patient is awake and responsive. Pain and nausea are reasonably well controlled. Vital signs are stable and clinically acceptable. Oxygen saturation is clinically acceptable. There are no apparent anesthetic complications at this time. Patient is ready for discharge.

## 2014-08-30 NOTE — Discharge Instructions (Signed)
Routine instructions for laparoscopy, endometrial ablation and TVT  DISCHARGE INSTRUCTIONS: Laparoscopy  No Ibuprofen containing products until after 2:15 pm today.  The following instructions have been prepared to help you care for yourself upon your return home today.  Wound care:  Do not get the incision wet for the first 24 hours. The incision should be kept clean and dry.  The Band-Aids or dressings may be removed the day after surgery.  Should the incision become sore, red, and swollen after the first week, check with your doctor.  Personal hygiene:  Shower the day after your procedure.  Activity and limitations:  Do NOT drive or operate any equipment today.  Do NOT lift anything more than 15 pounds for 2-3 weeks after surgery.  Do NOT rest in bed all day.  Walking is encouraged. Walk each day, starting slowly with 5-minute walks 3 or 4 times a day. Slowly increase the length of your walks.  Walk up and down stairs slowly.  Do NOT do strenuous activities, such as golfing, playing tennis, bowling, running, biking, weight lifting, gardening, mowing, or vacuuming for 2-4 weeks. Ask your doctor when it is okay to start.  Diet: Eat a light meal as desired this evening. You may resume your usual diet tomorrow.  Return to work: This is dependent on the type of work you do. For the most part you can return to a desk job within a week of surgery. If you are more active at work, please discuss this with your doctor.  What to expect after your surgery: You may have a slight burning sensation when you urinate on the first day. You may have a very small amount of blood in the urine. Expect to have a small amount of vaginal discharge/light bleeding for 1-2 weeks. It is not unusual to have abdominal soreness and bruising for up to 2 weeks. You may be tired and need more rest for about 1 week. You may experience shoulder pain for 24-72 hours. Lying flat in bed may relieve it.  Call your  doctor for any of the following:  Develop a fever of 100.4 or greater  Inability to urinate 6 hours after discharge from hospital  Severe pain not relieved by pain medications  Persistent of heavy bleeding at incision site  Redness or swelling around incision site after a week  Increasing nausea or vomiting  Patient Signature________________________________________ Nurse Signature_________________________________________

## 2014-08-30 NOTE — Interval H&P Note (Signed)
History and Physical Interval Note:  08/30/2014 7:07 AM  Natalie Yang  has presented today for surgery, with the diagnosis of Sterilization, SUI  The various methods of treatment have been discussed with the patient and family. After consideration of risks, benefits and other options for treatment, the patient has consented to  Procedure(s) with comments: HYSTEROSCOPY WITH NOVASURE (N/A) - Dr only needs 1 1/2hrs OR time INTRAUTERINE DEVICE (IUD) REMOVAL (N/A) LAPAROSCOPIC TUBAL LIGATION (Bilateral) TRANSVAGINAL TAPE (TVT) PROCEDURE (N/A) as a surgical intervention .  The patient's history has been reviewed, patient examined, no change in status, stable for surgery.  I have reviewed the patient's chart and labs.  Questions were answered to the patient's satisfaction.     Ardean Simonich D

## 2014-09-02 ENCOUNTER — Encounter (HOSPITAL_COMMUNITY): Payer: Self-pay | Admitting: Obstetrics and Gynecology

## 2014-10-14 ENCOUNTER — Other Ambulatory Visit: Payer: Self-pay

## 2014-10-14 DIAGNOSIS — Z1231 Encounter for screening mammogram for malignant neoplasm of breast: Secondary | ICD-10-CM

## 2014-11-01 ENCOUNTER — Ambulatory Visit: Payer: BLUE CROSS/BLUE SHIELD

## 2014-11-01 ENCOUNTER — Ambulatory Visit
Admission: RE | Admit: 2014-11-01 | Discharge: 2014-11-01 | Disposition: A | Discharge status: Home / Self Care | Payer: BLUE CROSS/BLUE SHIELD | Source: Ambulatory Visit

## 2014-11-01 DIAGNOSIS — Z1231 Encounter for screening mammogram for malignant neoplasm of breast: Secondary | ICD-10-CM

## 2016-01-12 ENCOUNTER — Other Ambulatory Visit: Payer: Self-pay | Admitting: Family Medicine

## 2016-01-12 DIAGNOSIS — Z1231 Encounter for screening mammogram for malignant neoplasm of breast: Secondary | ICD-10-CM

## 2016-01-22 ENCOUNTER — Ambulatory Visit
Admission: RE | Admit: 2016-01-22 | Discharge: 2016-01-22 | Disposition: A | Discharge status: Home / Self Care | Payer: BLUE CROSS/BLUE SHIELD | Source: Ambulatory Visit | Attending: Family Medicine | Admitting: Family Medicine

## 2016-01-22 ENCOUNTER — Other Ambulatory Visit: Payer: Self-pay | Admitting: Family Medicine

## 2016-01-22 DIAGNOSIS — M79622 Pain in left upper arm: Secondary | ICD-10-CM

## 2016-01-22 DIAGNOSIS — Z1231 Encounter for screening mammogram for malignant neoplasm of breast: Secondary | ICD-10-CM

## 2016-05-01 ENCOUNTER — Encounter (HOSPITAL_BASED_OUTPATIENT_CLINIC_OR_DEPARTMENT_OTHER): Payer: Self-pay | Admitting: Emergency Medicine

## 2016-05-01 ENCOUNTER — Emergency Department (HOSPITAL_BASED_OUTPATIENT_CLINIC_OR_DEPARTMENT_OTHER)
Admission: EM | Admit: 2016-05-01 | Discharge: 2016-05-01 | Disposition: A | Discharge status: Home / Self Care | Payer: BLUE CROSS/BLUE SHIELD | Attending: Emergency Medicine | Admitting: Emergency Medicine

## 2016-05-01 DIAGNOSIS — J45909 Unspecified asthma, uncomplicated: Secondary | ICD-10-CM | POA: Diagnosis not present

## 2016-05-01 DIAGNOSIS — Z87891 Personal history of nicotine dependence: Secondary | ICD-10-CM | POA: Insufficient documentation

## 2016-05-01 DIAGNOSIS — R220 Localized swelling, mass and lump, head: Secondary | ICD-10-CM | POA: Insufficient documentation

## 2016-05-01 DIAGNOSIS — Z79899 Other long term (current) drug therapy: Secondary | ICD-10-CM | POA: Insufficient documentation

## 2016-05-01 DIAGNOSIS — R51 Headache: Secondary | ICD-10-CM | POA: Diagnosis not present

## 2016-05-01 MED ORDER — TRAMADOL HCL 50 MG PO TABS
50.0000 mg | ORAL_TABLET | Freq: Once | ORAL | Status: AC
  Administered 2016-05-01: 50 mg via ORAL
  Filled 2016-05-01: qty 1

## 2016-05-01 MED ORDER — TRAMADOL HCL 50 MG PO TABS: 50.0000 mg | ORAL_TABLET | Freq: Four times a day (QID) | ORAL | 0 refills | Status: DC | PRN

## 2016-05-01 MED ORDER — AMOXICILLIN-POT CLAVULANATE 875-125 MG PO TABS
1.0000 | ORAL_TABLET | Freq: Once | ORAL | Status: AC
  Administered 2016-05-01: 1 via ORAL
  Filled 2016-05-01: qty 1

## 2016-05-01 MED ORDER — AMOXICILLIN-POT CLAVULANATE 875-125 MG PO TABS: 1.0000 | ORAL_TABLET | Freq: Two times a day (BID) | ORAL | 0 refills | Status: DC

## 2016-05-01 NOTE — ED Provider Notes (Signed)
[facility] Provider Note   CSN: [id number] Arrival date & time: 05/01/16  1625  By signing my name below, I, Clarisse GougeXavier Herndon, attest that this documentation has been prepared under the direction and in the presence of Fayrene HelperBowie Wilho Sharpley. Electronically Signed: Clarisse GougeXavier Herndon, Scribe. 05/01/16. 6:17 PM.    History   Chief Complaint Chief Complaint  Patient presents with  . Facial Swelling   HPI Comments: Natalie Yang is a 45 y.o. female who presents to the Emergency Department complaining of gradual onset, constant right side dental pain and facial swelling. Pt reports right side facial swelling and pain radiating to the eye, nose and roof of mouth. She further reports headache, a front right side warm and swollen mouth sore and impaired speech. She has not tried treatments at home and she describes her eye pain as feeling "different" and unaffected by occular movement. She reports FMHx and PMHx of abscess. Pt denies fever, rhinorrhea, sneeze, cough and ear pain. Codeine and demerol allergy.   The history is provided by the patient. No language interpreter was used.    Past Medical History:  Diagnosis Date  . Asthma   . GERD (gastroesophageal reflux disease)   . Headache    migraines  . PONV (postoperative nausea and vomiting)   . Vaginal delivery 1997    Patient Active Problem List   Diagnosis Date Noted  . Migraine 11/01/2013    Past Surgical History:  Procedure Laterality Date  . BLADDER SUSPENSION N/A 08/30/2014   Procedure: TRANSVAGINAL TAPE (TVT) PROCEDURE;  Surgeon: Lavina Hammanodd Meisinger, MD;  Location: [facility];  Service: Gynecology;  Laterality: N/A;  . CHOLECYSTECTOMY    . CYSTOSCOPY N/A 08/30/2014   Procedure: CYSTOSCOPY;  Surgeon: Lavina Hammanodd Meisinger, MD;  Location: [facility];  Service: Gynecology;  Laterality: N/A;  . HYSTEROSCOPY WITH NOVASURE N/A 08/30/2014   Procedure: HYSTEROSCOPY WITH NOVASURE;  Surgeon: Lavina Hammanodd Meisinger, MD;  Location: [facility];  Service: Gynecology;   Laterality: N/A;  Dr only needs 1 1/2hrs OR time  . IUD REMOVAL N/A 08/30/2014   Procedure: INTRAUTERINE DEVICE (IUD) REMOVAL;  Surgeon: Lavina Hammanodd Meisinger, MD;  Location: [facility];  Service: Gynecology;  Laterality: N/A;  . KNEE SURGERY    . LAPAROSCOPIC TUBAL LIGATION Bilateral 08/30/2014   Procedure: LAPAROSCOPIC TUBAL LIGATION;  Surgeon: Lavina Hammanodd Meisinger, MD;  Location: [facility];  Service: Gynecology;  Laterality: Bilateral;  . NOSE SURGERY     x 5  . uterine ablation      OB History    No data available       Home Medications    Prior to Admission medications   Medication Sig Start Date End Date Taking? Authorizing Provider  acyclovir (ZOVIRAX) 200 MG capsule Take 200 mg by mouth daily.   Yes Historical Provider, MD  albuterol (PROVENTIL HFA;VENTOLIN HFA) 108 (90 BASE) MCG/ACT inhaler Inhale 1-2 puffs into the lungs every 6 (six) hours as needed for wheezing or shortness of breath.   Yes Historical Provider, MD  butalbital-acetaminophen-caffeine (FIORICET, ESGIC) 50-325-40 MG per tablet Take 1 tablet by mouth 2 (two) times daily as needed for headache or migraine.    Yes Historical Provider, MD  Echinacea-Goldenseal (ECHINACEA COMB/GOLDEN SEAL PO) Take 1 tablet by mouth daily.   Yes Historical Provider, MD  estradiol (ESTRACE) 1 MG tablet Take 1 mg by mouth daily.   Yes Historical Provider, MD  FLUoxetine (PROZAC) 10 MG capsule Take 10 mg by mouth daily.   Yes Historical Provider, MD  methylphenidate (RITALIN) 20  MG tablet Take 20 mg by mouth daily.   Yes Historical Provider, MD  SUMAtriptan (IMITREX) 100 MG tablet Take 100 mg by mouth every 2 (two) hours as needed for migraine. May repeat in 2 hours if headache persists or recurs.   Yes Historical Provider, MD  triamcinolone cream (KENALOG) 0.5 % Apply 1 application topically at bedtime as needed (dry skin on hands).   Yes Historical Provider, MD  cetirizine (ZYRTEC) 10 MG tablet Take 10 mg by mouth daily.    Historical Provider, MD    estradiol-norethindrone (ACTIVELLA) 1-0.5 MG per tablet Take 1 tablet by mouth daily.    Historical Provider, MD  HYDROcodone-acetaminophen (NORCO) 5-325 MG per tablet Take 1-2 tablets by mouth every 6 (six) hours as needed for moderate pain. 08/30/14   Lavina Hamman, MD  ibuprofen (ADVIL,MOTRIN) 800 MG tablet Take 800 mg by mouth at bedtime as needed (back pain).    Historical Provider, MD    Family History No family history on file.  Social History Social History  Substance Use Topics  . Smoking status: Former Games developer  . Smokeless tobacco: Never Used  . Alcohol use Yes     Comment: socially     Allergies   Codeine and Demerol [meperidine]   Review of Systems Review of Systems  Constitutional: Negative for fever.  HENT: Positive for dental problem, facial swelling, mouth sores and voice change (impaired speech). Negative for ear pain, rhinorrhea and sneezing.   Eyes: Positive for pain.  Respiratory: Negative for cough.   Neurological: Positive for headaches.     Physical Exam Updated Vital Signs BP 139/92 (BP Location: Right Arm)   Pulse 77   Temp 98.1 F (36.7 C) (Oral)   Resp 18   Ht 5\' 8"  (1.727 m)   Wt 137 lb (62.1 kg)   SpO2 100%   BMI 20.83 kg/m   Physical Exam  Constitutional: She is oriented to person, place, and time. She appears well-developed and well-nourished.  HENT:  Head: Normocephalic.  Mouth/Throat: No trismus in the jaw.   Tenderness noted to the right oral mucosal region and right upper gum line around tooth number 4-6 witho ut obvious abscess. No erythema to surrounding.  Tenderness noted to the right nasal ala area.  Eyes: EOM are normal. Pupils are equal, round, and reactive to light.  Neck: Normal range of motion.  Cardiovascular: Normal pulses.   Pulmonary/Chest: Effort normal and breath sounds normal.  Abdominal: She exhibits no distension.  Musculoskeletal: Normal range of motion.  Neurological: She is alert and oriented to person,  place, and time. She has normal strength. No cranial nerve deficit or sensory deficit. GCS eye subscore is 4. GCS verbal subscore is 5. GCS motor subscore is 6.  Psychiatric: She has a normal mood and affect.  Nursing note and vitals reviewed.    ED Treatments / Results  DIAGNOSTIC STUDIES: Oxygen Saturation is 100% on RA, normal by my interpretation.    COORDINATION OF CARE: 6:18 PM Suspect periapical abscess vs. Preseptal cellulitis.  Discussed advanced imaging versus watchful waiting with antibiotics. Pt has chosen to take antibiotics and return for advanced imaging if symptoms worsen. Will order antibiotics. Discussed treatment plan with pt at bedside and pt agreed to plan. Doubt stroke.   Labs (all labs ordered are listed, but only abnormal results are displayed) Labs Reviewed - No data to display  EKG  EKG Interpretation None       Radiology No results found.  Procedures Procedures (  including critical care time)  Medications Ordered in ED Medications - No data to display   Initial Impression / Assessment and Plan / ED Course  I have reviewed the triage vital signs and the nursing notes.  Pertinent labs & imaging results that were available during my care of the patient were reviewed by me and considered in my medical decision making (see chart for details).  Clinical Course     Patient with dentalgia.  No abscess requiring immediate incision and drainage.  Exam not concerning for Ludwig's angina or pharyngeal abscess.  Will treat with augmentin and tramadol. Pt instructed to follow-up with dentist.  Discussed return precautions. Pt safe for discharge.   Final Clinical Impressions(s) / ED Diagnoses   Final diagnoses:  Right facial swelling    New Prescriptions New Prescriptions   AMOXICILLIN-CLAVULANATE (AUGMENTIN) 875-125 MG TABLET    Take 1 tablet by mouth 2 (two) times daily. One po bid x 7 days   TRAMADOL (ULTRAM) 50 MG TABLET    Take 1 tablet (50 mg  total) by mouth every 6 (six) hours as needed for moderate pain or severe pain.  I personally performed the services described in this documentation, which was scribed in my presence. The recorded information has been reviewed and is accurate.      Fayrene HelperBowie Jull Harral, PA-C 05/01/16 Rickey [id number]    Rolan BuccoMelanie Belfi, MD 05/01/16 2232

## 2016-05-01 NOTE — Discharge Instructions (Signed)
Your facial pain is likely due to an infection.  Please take antibiotic with food as prescribed for the full duration.  Take tramadol with food as needed for pain.  Return in 48 hrs if you notice no improvement.

## 2016-05-01 NOTE — ED Triage Notes (Signed)
Pt c/o upper palate swelling and right cheek swelling since Tuesday.  Pt states the swelling is now impacting her speech.  Pt denies and SOB and is able to converse without distress but with some slurring.

## 2016-05-02 ENCOUNTER — Emergency Department (HOSPITAL_BASED_OUTPATIENT_CLINIC_OR_DEPARTMENT_OTHER)
Admission: EM | Admit: 2016-05-02 | Discharge: 2016-05-02 | Disposition: A | Discharge status: Home / Self Care | Payer: BLUE CROSS/BLUE SHIELD | Attending: Emergency Medicine | Admitting: Emergency Medicine

## 2016-05-02 ENCOUNTER — Emergency Department (HOSPITAL_BASED_OUTPATIENT_CLINIC_OR_DEPARTMENT_OTHER): Payer: BLUE CROSS/BLUE SHIELD

## 2016-05-02 ENCOUNTER — Encounter (HOSPITAL_BASED_OUTPATIENT_CLINIC_OR_DEPARTMENT_OTHER): Payer: Self-pay | Admitting: *Deleted

## 2016-05-02 DIAGNOSIS — K047 Periapical abscess without sinus: Secondary | ICD-10-CM | POA: Insufficient documentation

## 2016-05-02 DIAGNOSIS — R110 Nausea: Secondary | ICD-10-CM | POA: Insufficient documentation

## 2016-05-02 DIAGNOSIS — Z791 Long term (current) use of non-steroidal anti-inflammatories (NSAID): Secondary | ICD-10-CM | POA: Diagnosis not present

## 2016-05-02 DIAGNOSIS — R220 Localized swelling, mass and lump, head: Secondary | ICD-10-CM | POA: Diagnosis present

## 2016-05-02 DIAGNOSIS — Z79899 Other long term (current) drug therapy: Secondary | ICD-10-CM | POA: Diagnosis not present

## 2016-05-02 DIAGNOSIS — J45909 Unspecified asthma, uncomplicated: Secondary | ICD-10-CM | POA: Diagnosis not present

## 2016-05-02 DIAGNOSIS — Z87891 Personal history of nicotine dependence: Secondary | ICD-10-CM | POA: Insufficient documentation

## 2016-05-02 LAB — BASIC METABOLIC PANEL
Anion gap: 8 (ref 5–15)
BUN: 10 mg/dL (ref 6–20)
CALCIUM: 9.3 mg/dL (ref 8.9–10.3)
CO2: 26 mmol/L (ref 22–32)
Chloride: 102 mmol/L (ref 101–111)
Creatinine, Ser: 0.56 mg/dL (ref 0.44–1.00)
GFR calc Af Amer: >60 mL/min (ref >60)
GFR calc non Af Amer: >60 mL/min (ref >60)
GLUCOSE: 127 mg/dL — AB (ref 65–99)
Potassium: 4.2 mmol/L (ref 3.5–5.1)
Sodium: 136 mmol/L (ref 135–145)

## 2016-05-02 LAB — CBC WITH DIFFERENTIAL/PLATELET
BASOS PCT: 0 %
Basophils Absolute: 0 10*3/uL (ref 0.0–0.1)
Eosinophils Absolute: 0.4 10*3/uL (ref 0.0–0.7)
Eosinophils Relative: 5 %
HCT: 42.4 % (ref 36.0–46.0)
Hemoglobin: 14.4 g/dL (ref 12.0–15.0)
Lymphocytes Relative: 17 %
Lymphs Abs: 1.4 10*3/uL (ref 0.7–4.0)
MCH: 32.4 pg (ref 26.0–34.0)
MCHC: 34 g/dL (ref 30.0–36.0)
MCV: 95.3 fL (ref 78.0–100.0)
MONO ABS: 0.9 10*3/uL (ref 0.1–1.0)
Monocytes Relative: 11 %
Neutro Abs: 5.7 10*3/uL (ref 1.7–7.7)
Neutrophils Relative %: 67 %
Platelets: 273 10*3/uL (ref 150–400)
RBC: 4.45 MIL/uL (ref 3.87–5.11)
RDW: 12.6 % (ref 11.5–15.5)
WBC: 8.5 10*3/uL (ref 4.0–10.5)

## 2016-05-02 MED ORDER — ONDANSETRON HCL 4 MG/2ML IJ SOLN
4.0000 mg | Freq: Once | INTRAMUSCULAR | Status: AC
  Administered 2016-05-02: 4 mg via INTRAVENOUS
  Filled 2016-05-02: qty 2

## 2016-05-02 MED ORDER — KETOROLAC TROMETHAMINE 30 MG/ML IJ SOLN
30.0000 mg | Freq: Once | INTRAMUSCULAR | Status: AC
  Administered 2016-05-02: 30 mg via INTRAVENOUS
  Filled 2016-05-02: qty 1

## 2016-05-02 MED ORDER — HYDROCODONE-ACETAMINOPHEN 5-325 MG PO TABS: 1.0000 | ORAL_TABLET | Freq: Four times a day (QID) | ORAL | 0 refills | Status: DC | PRN

## 2016-05-02 MED ORDER — MORPHINE SULFATE (PF) 4 MG/ML IV SOLN: 4.0000 mg | Freq: Once | INTRAVENOUS | Status: DC

## 2016-05-02 MED ORDER — IOPAMIDOL (ISOVUE-300) INJECTION 61%
100.0000 mL | Freq: Once | INTRAVENOUS | Status: AC | PRN
  Administered 2016-05-02: 100 mL via INTRAVENOUS

## 2016-05-02 MED ORDER — SODIUM CHLORIDE 0.9 % IV BOLUS (SEPSIS)
1000.0000 mL | Freq: Once | INTRAVENOUS | Status: AC
  Administered 2016-05-02: 1000 mL via INTRAVENOUS

## 2016-05-02 MED ORDER — ONDANSETRON 4 MG PO TBDP: 4.0000 mg | ORAL_TABLET | Freq: Three times a day (TID) | ORAL | 0 refills | Status: DC | PRN

## 2016-05-02 MED ORDER — MORPHINE SULFATE (PF) 4 MG/ML IV SOLN
4.0000 mg | Freq: Once | INTRAVENOUS | Status: AC
  Administered 2016-05-02: 4 mg via INTRAVENOUS
  Filled 2016-05-02: qty 1

## 2016-05-02 NOTE — ED Provider Notes (Signed)
[facility] Provider Note   CSN: [id number] Arrival date & time: 05/02/16  1051     History   Chief Complaint Chief Complaint  Patient presents with  . Facial Swelling    HPI Natalie Yang is a 45 y.o. female.  HPI   Natalie Yang is a 45 y.o. female, with a history of Asthma and GERD, presenting to the ED with worsening right-sided facial swelling and pain originally beginning 2 days ago. Patient was seen yesterday for the same, prescribed Augmentin and tramadol. Patient has had 3 doses of the Augmentin. Patient states her swelling and pain has significantly increased over the past 24 hours. She now endorses swelling extending to under her right eye, back towards the ear, and pushing her nose to the side. Patient endorses difficulty talking and a feeling of swelling in the right side of her neck as well as some nausea. Patient denies fever/chills, vomiting, difficulty swallowing/breathing, or any other complaints.      Past Medical History:  Diagnosis Date  . Asthma   . GERD (gastroesophageal reflux disease)   . Headache    migraines  . PONV (postoperative nausea and vomiting)   . Vaginal delivery 1997    Patient Active Problem List   Diagnosis Date Noted  . Migraine 11/01/2013    Past Surgical History:  Procedure Laterality Date  . BLADDER SUSPENSION N/A 08/30/2014   Procedure: TRANSVAGINAL TAPE (TVT) PROCEDURE;  Surgeon: Lavina Hamman, MD;  Location: [facility];  Service: Gynecology;  Laterality: N/A;  . CHOLECYSTECTOMY    . CYSTOSCOPY N/A 08/30/2014   Procedure: CYSTOSCOPY;  Surgeon: Lavina Hamman, MD;  Location: [facility];  Service: Gynecology;  Laterality: N/A;  . HYSTEROSCOPY WITH NOVASURE N/A 08/30/2014   Procedure: HYSTEROSCOPY WITH NOVASURE;  Surgeon: Lavina Hamman, MD;  Location: [facility];  Service: Gynecology;  Laterality: N/A;  Dr only needs 1 1/2hrs OR time  . IUD REMOVAL N/A 08/30/2014   Procedure: INTRAUTERINE DEVICE (IUD) REMOVAL;  Surgeon:  Lavina Hamman, MD;  Location: [facility];  Service: Gynecology;  Laterality: N/A;  . KNEE SURGERY    . LAPAROSCOPIC TUBAL LIGATION Bilateral 08/30/2014   Procedure: LAPAROSCOPIC TUBAL LIGATION;  Surgeon: Lavina Hamman, MD;  Location: [facility];  Service: Gynecology;  Laterality: Bilateral;  . NOSE SURGERY     x 5  . uterine ablation      OB History    No data available       Home Medications    Prior to Admission medications   Medication Sig Start Date End Date Taking? Authorizing Provider  acyclovir (ZOVIRAX) 200 MG capsule Take 200 mg by mouth daily.    Historical Provider, MD  albuterol (PROVENTIL HFA;VENTOLIN HFA) 108 (90 BASE) MCG/ACT inhaler Inhale 1-2 puffs into the lungs every 6 (six) hours as needed for wheezing or shortness of breath.    Historical Provider, MD  amoxicillin-clavulanate (AUGMENTIN) 875-125 MG tablet Take 1 tablet by mouth 2 (two) times daily. One po bid x 7 days 05/01/16   Fayrene Helper, PA-C  butalbital-acetaminophen-caffeine (FIORICET, ESGIC) [phone] MG per tablet Take 1 tablet by mouth 2 (two) times daily as needed for headache or migraine.     Historical Provider, MD  cetirizine (ZYRTEC) 10 MG tablet Take 10 mg by mouth daily.    Historical Provider, MD  Echinacea-Goldenseal (ECHINACEA COMB/GOLDEN SEAL PO) Take 1 tablet by mouth daily.    Historical Provider, MD  estradiol (ESTRACE) 1 MG tablet Take 1 mg by mouth daily.  Historical Provider, MD  estradiol-norethindrone (ACTIVELLA) 1-0.5 MG per tablet Take 1 tablet by mouth daily.    Historical Provider, MD  FLUoxetine (PROZAC) 10 MG capsule Take 10 mg by mouth daily.    Historical Provider, MD  HYDROcodone-acetaminophen (NORCO) 5-325 MG per tablet Take 1-2 tablets by mouth every 6 (six) hours as needed for moderate pain. 08/30/14   Lavina Hammanodd Meisinger, MD  HYDROcodone-acetaminophen (NORCO/VICODIN) 5-325 MG tablet Take 1-2 tablets by mouth every 6 (six) hours as needed. 05/02/16   Shawn C Joy, PA-C  ibuprofen  (ADVIL,MOTRIN) 800 MG tablet Take 800 mg by mouth at bedtime as needed (back pain).    Historical Provider, MD  methylphenidate (RITALIN) 20 MG tablet Take 20 mg by mouth daily.    Historical Provider, MD  ondansetron (ZOFRAN ODT) 4 MG disintegrating tablet Take 1 tablet (4 mg total) by mouth every 8 (eight) hours as needed for nausea or vomiting. 05/02/16   Shawn C Joy, PA-C  SUMAtriptan (IMITREX) 100 MG tablet Take 100 mg by mouth every 2 (two) hours as needed for migraine. May repeat in 2 hours if headache persists or recurs.    Historical Provider, MD  traMADol (ULTRAM) 50 MG tablet Take 1 tablet (50 mg total) by mouth every 6 (six) hours as needed for moderate pain or severe pain. 05/01/16   Fayrene HelperBowie Tran, PA-C  triamcinolone cream (KENALOG) 0.5 % Apply 1 application topically at bedtime as needed (dry skin on hands).    Historical Provider, MD    Family History No family history on file.  Social History Social History  Substance Use Topics  . Smoking status: Former Games developermoker  . Smokeless tobacco: Never Used  . Alcohol use Yes     Comment: socially     Allergies   Codeine and Demerol [meperidine]   Review of Systems Review of Systems  Constitutional: Negative for chills and fever.  HENT: Positive for dental problem, facial swelling and voice change. Negative for drooling and trouble swallowing.   Respiratory: Negative for cough and shortness of breath.   Gastrointestinal: Positive for nausea. Negative for vomiting.  Musculoskeletal: Negative for neck stiffness.  All other systems reviewed and are negative.    Physical Exam Updated Vital Signs BP 122/87 (BP Location: Left Arm)   Pulse 86   Temp 98.3 F (36.8 C)   Resp 22   Ht 5\' 8"  (1.727 m)   Wt 62.1 kg   SpO2 100%   BMI 20.83 kg/m   Physical Exam  Constitutional: She appears well-developed and well-nourished. No distress.  HENT:  Head: Normocephalic and atraumatic.  Swelling and erythema noted in the right  maxillary buccal surface. Swelling, tenderness, and erythema is evident in the face extending to the right infraorbital region. Swelling also noted to be pushing the patient's nose to the left. Patient readily handles oral secretions without difficulty or drooling.   Eyes: Conjunctivae are normal.  Neck: Normal range of motion. Neck supple.  No swelling or tenderness noted to the soft tissues of the neck.  Cardiovascular: Normal rate, regular rhythm, normal heart sounds and intact distal pulses.   Pulmonary/Chest: Effort normal and breath sounds normal. No respiratory distress.  Abdominal: Soft. There is no tenderness. There is no guarding.  Musculoskeletal: She exhibits no edema.  Lymphadenopathy:    She has no cervical adenopathy.  Neurological: She is alert.  Skin: Skin is warm and dry. She is not diaphoretic.  Psychiatric: She has a normal mood and affect. Her behavior is  normal.  Nursing note and vitals reviewed.    ED Treatments / Results  Labs (all labs ordered are listed, but only abnormal results are displayed) Labs Reviewed  BASIC METABOLIC PANEL - Abnormal; Notable for the following:       Result Value   Glucose, Bld 127 (*)    All other components within normal limits  CBC WITH DIFFERENTIAL/PLATELET    EKG  EKG Interpretation None       Radiology Ct Maxillofacial W Contrast  Result Date: 05/02/2016 CLINICAL DATA:  Facial swelling on the right getting worse over 24 hours. EXAM: CT MAXILLOFACIAL WITH CONTRAST TECHNIQUE: Multidetector CT imaging of the maxillofacial structures was performed with intravenous contrast. Multiplanar CT image reconstructions were also generated. A small metallic BB was placed on the right temple in order to reliably differentiate right from left. CONTRAST:  ISOVUE-300 IOPAMIDOL (ISOVUE-300) INJECTION 61% COMPARISON:  None. FINDINGS: Osseous: The seventh tooth has a large periapical erosion which decompresses through the lingual and  buccal cortex. Subperiosteal abscess measuring up to 12 by 5 mm along the buccal surface. No soft tissue emphysema. No floor of mouth swelling. Remote nasal arch fractures. Orbits: Negative. No traumatic or inflammatory finding. Sinuses: No active sinusitis. Soft tissues: Mild soft tissue swelling around the abscess reported above. Limited intracranial: Negative IMPRESSION: 12 mm subperiosteal abscess related to the carious seventh tooth. Electronically Signed   By: Marnee Spring M.D.   On: 05/02/2016 14:21    Procedures Procedures (including critical care time)  Medications Ordered in ED Medications  sodium chloride 0.9 % bolus 1,000 mL (0 mLs Intravenous Stopped 05/02/16 1420)  morphine 4 MG/ML injection 4 mg (4 mg Intravenous Given 05/02/16 1318)  ondansetron (ZOFRAN) injection 4 mg (4 mg Intravenous Given 05/02/16 1314)  iopamidol (ISOVUE-300) 61 % injection 100 mL (100 mLs Intravenous Contrast Given 05/02/16 1401)  ketorolac (TORADOL) 30 MG/ML injection 30 mg (30 mg Intravenous Given 05/02/16 1427)     Initial Impression / Assessment and Plan / ED Course  I have reviewed the triage vital signs and the nursing notes.  Pertinent labs & imaging results that were available during my care of the patient were reviewed by me and considered in my medical decision making (see chart for details).  Clinical Course      Patient presents with dental pain and facial swelling for the last 3 days. Hansel Starling, RN, cared for this patient yesterday and today. She reports the patient's swelling is significantly worse than yesterday. I suspect that the patient has simply not allowed enough time for the antibiotic to take effect. However, due to the reported speed of the swelling, CT investigation is warranted. Upon reassessment, patient's swelling is improved. Her pain is also improved. Dental abscess on CT without infiltration into the surrounding tissues. Patient continues to be well-appearing and has no  signs of sepsis. Patient encouraged to follow up with a dentist. Return precautions discussed.   Vitals:   05/02/16 1056 05/02/16 1057 05/02/16 1323 05/02/16 1519  BP: 122/87  116/71 114/76  Pulse: 86  75 65  Resp: 22  18 16   Temp: 98.3 F (36.8 C)     SpO2: 100%  98% 98%  Weight:  62.1 kg    Height:  5\' 8"  (1.727 m)       Final Clinical Impressions(s) / ED Diagnoses   Final diagnoses:  Dental abscess    New Prescriptions Discharge Medication List as of 05/02/2016  2:47 PM    START  taking these medications   Details  !! HYDROcodone-acetaminophen (NORCO/VICODIN) 5-325 MG tablet Take 1-2 tablets by mouth every 6 (six) hours as needed., Starting Sun 05/02/2016, Print    ondansetron (ZOFRAN ODT) 4 MG disintegrating tablet Take 1 tablet (4 mg total) by mouth every 8 (eight) hours as needed for nausea or vomiting., Starting Sun 05/02/2016, Print     !! - Potential duplicate medications found. Please discuss with provider.       Anselm PancoastShawn C Joy, PA-C 05/02/16 1656    Gwyneth SproutWhitney Plunkett, MD 05/09/16 2100

## 2016-05-02 NOTE — ED Triage Notes (Addendum)
Patient returns today for recheck of her facial swelling.  States the swelling has gotten worse since last night.  States the tramadol is not helping.  Patient is very anxious about the swelling.

## 2016-05-02 NOTE — ED Notes (Signed)
RT listening to pt.

## 2016-05-02 NOTE — ED Notes (Signed)
Per Ileene MusaJessie, Rad Tech stated that the pt said she is still having some facial pain

## 2016-05-02 NOTE — Discharge Instructions (Signed)
You have been seen today for dental pain. There was a dental abscess noted on the CT scan. You should follow up with a dentist as soon as possible. This problem will not resolve on its own without the care of a dentist. Use ibuprofen or naproxen for pain. Use the viscous lidocaine for mouth pain. Swish with the lidocaine and spit it out. Do not swallow it. Vicodin for severe pain. Do not use the Vicodin while driving or performing other dangerous activities. Continue to take the antibiotics as prescribed.

## 2016-05-02 NOTE — ED Notes (Signed)
RT assessed patient in triage. Swelling noted, but no distress. BBS clear, SAT 100%. Patient currently waiting in triage room.

## 2017-01-24 ENCOUNTER — Other Ambulatory Visit: Payer: Self-pay | Admitting: Family Medicine

## 2017-01-24 DIAGNOSIS — Z1231 Encounter for screening mammogram for malignant neoplasm of breast: Secondary | ICD-10-CM

## 2017-02-08 ENCOUNTER — Ambulatory Visit
Admission: RE | Admit: 2017-02-08 | Discharge: 2017-02-08 | Disposition: A | Discharge status: Home / Self Care | Payer: BLUE CROSS/BLUE SHIELD | Source: Ambulatory Visit | Attending: Family Medicine | Admitting: Family Medicine

## 2017-02-08 DIAGNOSIS — Z1231 Encounter for screening mammogram for malignant neoplasm of breast: Secondary | ICD-10-CM

## 2017-02-10 ENCOUNTER — Other Ambulatory Visit: Payer: Self-pay | Admitting: Family Medicine

## 2017-02-10 DIAGNOSIS — R928 Other abnormal and inconclusive findings on diagnostic imaging of breast: Secondary | ICD-10-CM

## 2017-04-22 ENCOUNTER — Other Ambulatory Visit: Payer: BLUE CROSS/BLUE SHIELD

## 2017-05-18 ENCOUNTER — Ambulatory Visit
Admission: RE | Admit: 2017-05-18 | Discharge: 2017-05-18 | Disposition: A | Discharge status: Home / Self Care | Payer: BLUE CROSS/BLUE SHIELD | Source: Ambulatory Visit | Attending: Family Medicine | Admitting: Family Medicine

## 2017-05-18 ENCOUNTER — Ambulatory Visit: Payer: BLUE CROSS/BLUE SHIELD

## 2017-05-18 DIAGNOSIS — R928 Other abnormal and inconclusive findings on diagnostic imaging of breast: Secondary | ICD-10-CM

## 2018-06-12 ENCOUNTER — Other Ambulatory Visit: Payer: Self-pay | Admitting: Family Medicine

## 2018-06-12 ENCOUNTER — Ambulatory Visit
Admission: RE | Admit: 2018-06-12 | Discharge: 2018-06-12 | Disposition: A | Discharge status: Home / Self Care | Payer: BLUE CROSS/BLUE SHIELD | Source: Ambulatory Visit | Attending: Family Medicine | Admitting: Family Medicine

## 2018-06-12 DIAGNOSIS — Z1231 Encounter for screening mammogram for malignant neoplasm of breast: Secondary | ICD-10-CM

## 2019-03-02 ENCOUNTER — Emergency Department (HOSPITAL_COMMUNITY)
Admission: EM | Admit: 2019-03-02 | Discharge: 2019-03-02 | Disposition: A | Discharge status: Home / Self Care | Payer: PRIVATE HEALTH INSURANCE | Attending: Emergency Medicine | Admitting: Emergency Medicine

## 2019-03-02 ENCOUNTER — Emergency Department (HOSPITAL_COMMUNITY): Payer: PRIVATE HEALTH INSURANCE

## 2019-03-02 DIAGNOSIS — J45909 Unspecified asthma, uncomplicated: Secondary | ICD-10-CM | POA: Insufficient documentation

## 2019-03-02 DIAGNOSIS — Z79899 Other long term (current) drug therapy: Secondary | ICD-10-CM | POA: Insufficient documentation

## 2019-03-02 DIAGNOSIS — W25XXXA Contact with sharp glass, initial encounter: Secondary | ICD-10-CM | POA: Diagnosis not present

## 2019-03-02 DIAGNOSIS — Y92019 Unspecified place in single-family (private) house as the place of occurrence of the external cause: Secondary | ICD-10-CM | POA: Insufficient documentation

## 2019-03-02 DIAGNOSIS — S61012A Laceration without foreign body of left thumb without damage to nail, initial encounter: Secondary | ICD-10-CM | POA: Diagnosis not present

## 2019-03-02 DIAGNOSIS — Z87891 Personal history of nicotine dependence: Secondary | ICD-10-CM | POA: Diagnosis not present

## 2019-03-02 DIAGNOSIS — Y9389 Activity, other specified: Secondary | ICD-10-CM | POA: Diagnosis not present

## 2019-03-02 DIAGNOSIS — S61412A Laceration without foreign body of left hand, initial encounter: Secondary | ICD-10-CM

## 2019-03-02 DIAGNOSIS — Y999 Unspecified external cause status: Secondary | ICD-10-CM | POA: Diagnosis not present

## 2019-03-02 MED ORDER — TETANUS-DIPHTH-ACELL PERTUSSIS 5-2.5-18.5 LF-MCG/0.5 IM SUSP
0.5000 mL | Freq: Once | INTRAMUSCULAR | Status: AC
  Administered 2019-03-02: 12:00:00 0.5 mL via INTRAMUSCULAR
  Filled 2019-03-02: qty 0.5

## 2019-03-02 MED ORDER — HYDROCODONE-ACETAMINOPHEN 5-325 MG PO TABS
1.0000 | ORAL_TABLET | Freq: Once | ORAL | Status: AC
  Administered 2019-03-02: 12:00:00 1 via ORAL
  Filled 2019-03-02: qty 1

## 2019-03-02 MED ORDER — ONDANSETRON HCL 4 MG PO TABS
4.0000 mg | ORAL_TABLET | Freq: Once | ORAL | Status: AC
  Administered 2019-03-02: 12:00:00 4 mg via ORAL

## 2019-03-02 MED ORDER — ONDANSETRON HCL 4 MG PO TABS: 4.0000 mg | ORAL_TABLET | Freq: Four times a day (QID) | ORAL | 0 refills | Status: AC

## 2019-03-02 MED ORDER — HYDROCODONE-ACETAMINOPHEN 5-325 MG PO TABS: 1.0000 | ORAL_TABLET | ORAL | 0 refills | Status: AC | PRN

## 2019-03-02 NOTE — Consult Note (Addendum)
Reason for Consult:Left thumb lac Referring Physician: Nikeria Kalman Yang is an 48 y.o. female.  HPI: Natalie Yang was leaving her house and struggling with her door when she accidentally put her left hand through one of the window panes. She came to the ED for evaluation. She c/o localized pain to the area and some numbness to the thumb. The EDPA noted some weakness and hand surgery was consulted. She is RHD.  Past Medical History:  Diagnosis Date  . Asthma   . GERD (gastroesophageal reflux disease)   . Headache    migraines  . PONV (postoperative nausea and vomiting)   . Vaginal delivery 1997    Past Surgical History:  Procedure Laterality Date  . BLADDER SUSPENSION N/A 08/30/2014   Procedure: TRANSVAGINAL TAPE (TVT) PROCEDURE;  Surgeon: Cheri Fowler, MD;  Location: [facility];  Service: Gynecology;  Laterality: N/A;  . CHOLECYSTECTOMY    . CYSTOSCOPY N/A 08/30/2014   Procedure: CYSTOSCOPY;  Surgeon: Cheri Fowler, MD;  Location: [facility];  Service: Gynecology;  Laterality: N/A;  . HYSTEROSCOPY WITH NOVASURE N/A 08/30/2014   Procedure: HYSTEROSCOPY WITH NOVASURE;  Surgeon: Cheri Fowler, MD;  Location: [facility];  Service: Gynecology;  Laterality: N/A;  Dr only needs 1 1/2hrs OR time  . IUD REMOVAL N/A 08/30/2014   Procedure: INTRAUTERINE DEVICE (IUD) REMOVAL;  Surgeon: Cheri Fowler, MD;  Location: [facility];  Service: Gynecology;  Laterality: N/A;  . KNEE SURGERY    . LAPAROSCOPIC TUBAL LIGATION Bilateral 08/30/2014   Procedure: LAPAROSCOPIC TUBAL LIGATION;  Surgeon: Cheri Fowler, MD;  Location: [facility];  Service: Gynecology;  Laterality: Bilateral;  . NOSE SURGERY     x 5  . uterine ablation      No family history on file.  Social History:  reports that she has quit smoking. She has never used smokeless tobacco. She reports current alcohol use. She reports that she does not use drugs.  Allergies:  Allergies  Allergen Reactions  . Codeine Itching and Nausea And Vomiting  .  Demerol [Meperidine] Other (See Comments)    "makes me crazy"    Medications: I have reviewed the patient's current medications.  No results found for this or any previous visit (from the past 48 hour(s)).  Dg Hand Complete Left  Result Date: 03/02/2019 CLINICAL DATA:  The patient suffered a laceration of the left hand on a glass door pane today. Initial encounter. EXAM: LEFT HAND - COMPLETE 3+ VIEW COMPARISON:  None. FINDINGS: There is no evidence of fracture or dislocation. There is no evidence of arthropathy or other focal bone abnormality. Soft tissues are unremarkable. IMPRESSION: Negative exam. Electronically Signed   By: Inge Rise M.D.   On: 03/02/2019 11:29    Review of Systems  Constitutional: Negative for weight loss.  HENT: Negative for ear discharge, ear pain, hearing loss and tinnitus.   Eyes: Negative for blurred vision, double vision, photophobia and pain.  Respiratory: Negative for cough, sputum production and shortness of breath.   Cardiovascular: Negative for chest pain.  Gastrointestinal: Negative for abdominal pain, nausea and vomiting.  Genitourinary: Negative for dysuria, flank pain, frequency and urgency.  Musculoskeletal: Positive for joint pain (Left thumb). Negative for back pain, falls, myalgias and neck pain.  Neurological: Negative for dizziness, tingling, sensory change, focal weakness, loss of consciousness and headaches.  Endo/Heme/Allergies: Does not bruise/bleed easily.  Psychiatric/Behavioral: Negative for depression, memory loss and substance abuse. The patient is not nervous/anxious.    Blood pressure 115/74, pulse  82, temperature 98.5 F (36.9 C), temperature source Oral, resp. rate 20, SpO2 100 %. Physical Exam  Constitutional: She appears well-developed and well-nourished. No distress.  HENT:  Head: Normocephalic and atraumatic.  Eyes: Conjunctivae are normal. Right eye exhibits no discharge. Left eye exhibits no discharge. No scleral  icterus.  Neck: Normal range of motion.  Cardiovascular: Normal rate and regular rhythm.  Respiratory: Effort normal. No respiratory distress.  Musculoskeletal:     Comments: Left shoulder, elbow, wrist, digits- 1.5cm laceration base of thumb, mod TTP, 1/5 flexion and abduction, thumb paresthetic, no instability, no blocks to motion  Sens  Ax/R/M/U intact  Mot   Ax/ R/ PIN/ M/ AIN/ U intact  Rad 2+  Neurological: She is alert.  Skin: Skin is warm and dry. She is not diaphoretic.  Psychiatric: She has a normal mood and affect. Her behavior is normal.        Assessment/Plan: Left thumb lac -- EDPA to close skin and discharge. Dr. Janee Mornhompson will see on Tuesday to examine and plan surgery if indicated. Thumb spica for comfort.    Freeman CaldronMichael J. Jeffery, PA-C Orthopedic Surgery [phone] 03/02/2019, 1:29 PM   L thumb lac with possible sensory nerve and/or tendon injury.  ED to perform wound care, and I will plan delayed eval in office on Tuesday to help determine status of thumb RDN and intrinsics tendons.  Neil Crouchave Natalie Tersigni, MD Hand Surgery

## 2019-03-02 NOTE — ED Provider Notes (Signed)
Natalie Yang Provider Note   CSN: [id number] Arrival date & time: 03/02/19  1010     History   Chief Complaint Chief Complaint  Patient presents with  . Extremity Laceration    HPI Natalie Yang is a 48 y.o. female.     48 y.o female with a PMH of Asthma, GERD presents to the ED status post laceration of her left thumb prior to arrival.  Patient reports she was going out her house when she laid her left hand onto a window which she went through cutting her left thumb at the base.  She reports she has been feeling tingling sensation to the left thumb worse with movement, unable to barely move her finger.  Has not taken any medication for improvement in symptoms.  Reports the pain is 10 out of 10.  Her last tetanus vaccine was given approximately 7 years ago.  She denies any other complaints at this time, bleeding subsided with pressure dressing.  No other trauma or injuries described.  The history is provided by the patient and medical records.    Past Medical History:  Diagnosis Date  . Asthma   . GERD (gastroesophageal reflux disease)   . Headache    migraines  . PONV (postoperative nausea and vomiting)   . Vaginal delivery 1997    Patient Active Problem List   Diagnosis Date Noted  . Migraine 11/01/2013    Past Surgical History:  Procedure Laterality Date  . BLADDER SUSPENSION N/A 08/30/2014   Procedure: TRANSVAGINAL TAPE (TVT) PROCEDURE;  Surgeon: Cheri Fowler, MD;  Location: [facility];  Service: Gynecology;  Laterality: N/A;  . CHOLECYSTECTOMY    . CYSTOSCOPY N/A 08/30/2014   Procedure: CYSTOSCOPY;  Surgeon: Cheri Fowler, MD;  Location: [facility];  Service: Gynecology;  Laterality: N/A;  . HYSTEROSCOPY WITH NOVASURE N/A 08/30/2014   Procedure: HYSTEROSCOPY WITH NOVASURE;  Surgeon: Cheri Fowler, MD;  Location: [facility];  Service: Gynecology;  Laterality: N/A;  Dr only needs 1 1/2hrs OR time  . IUD REMOVAL N/A 08/30/2014   Procedure: INTRAUTERINE DEVICE (IUD) REMOVAL;  Surgeon: Cheri Fowler, MD;  Location: [facility];  Service: Gynecology;  Laterality: N/A;  . KNEE SURGERY    . LAPAROSCOPIC TUBAL LIGATION Bilateral 08/30/2014   Procedure: LAPAROSCOPIC TUBAL LIGATION;  Surgeon: Cheri Fowler, MD;  Location: [facility];  Service: Gynecology;  Laterality: Bilateral;  . NOSE SURGERY     x 5  . uterine ablation       OB History   No obstetric history on file.      Home Medications    Prior to Admission medications   Medication Sig Start Date End Date Taking? Authorizing Provider  acyclovir (ZOVIRAX) 200 MG capsule Take 200 mg by mouth daily.    [provider]  albuterol (PROVENTIL HFA;VENTOLIN HFA) 108 (90 BASE) MCG/ACT inhaler Inhale 1-2 puffs into the lungs every 6 (six) hours as needed for wheezing or shortness of breath.    [provider]  amoxicillin-clavulanate (AUGMENTIN) 875-125 MG tablet Take 1 tablet by mouth 2 (two) times daily. One po bid x 7 days 05/01/16   Domenic Moras, PA-C  butalbital-acetaminophen-caffeine (FIORICET, ESGIC) [phone] MG per tablet Take 1 tablet by mouth 2 (two) times daily as needed for headache or migraine.     [provider]  cetirizine (ZYRTEC) 10 MG tablet Take 10 mg by mouth daily.    [provider]  Echinacea-Goldenseal (ECHINACEA COMB/GOLDEN SEAL PO) Take 1  tablet by mouth daily.    [provider]  estradiol (ESTRACE) 1 MG tablet Take 1 mg by mouth daily.    [provider]  estradiol-norethindrone (ACTIVELLA) 1-0.5 MG per tablet Take 1 tablet by mouth daily.    [provider]  FLUoxetine (PROZAC) 10 MG capsule Take 10 mg by mouth daily.    [provider]  HYDROcodone-acetaminophen (NORCO/VICODIN) 5-325 MG tablet Take 1 tablet by mouth every 4 (four) hours as needed for up to 3 days. 03/02/19 03/05/19  Claude MangesSoto, Ralf Konopka, PA-C  ibuprofen (ADVIL,MOTRIN) 800 MG tablet Take 800 mg by mouth at bedtime as needed  (back pain).    [provider]  methylphenidate (RITALIN) 20 MG tablet Take 20 mg by mouth daily.    [provider]  ondansetron (ZOFRAN ODT) 4 MG disintegrating tablet Take 1 tablet (4 mg total) by mouth every 8 (eight) hours as needed for nausea or vomiting. 05/02/16   Joy, Shawn C, PA-C  ondansetron (ZOFRAN) 4 MG tablet Take 1 tablet (4 mg total) by mouth every 6 (six) hours for 5 days. 03/02/19 03/07/19  Claude MangesSoto, Latoyna Hird, PA-C  SUMAtriptan (IMITREX) 100 MG tablet Take 100 mg by mouth every 2 (two) hours as needed for migraine. May repeat in 2 hours if headache persists or recurs.    [provider]  traMADol (ULTRAM) 50 MG tablet Take 1 tablet (50 mg total) by mouth every 6 (six) hours as needed for moderate pain or severe pain. 05/01/16   Fayrene Helperran, Bowie, PA-C  triamcinolone cream (KENALOG) 0.5 % Apply 1 application topically at bedtime as needed (dry skin on hands).    [provider]    Family History No family history on file.  Social History Social History   Tobacco Use  . Smoking status: Former Games developermoker  . Smokeless tobacco: Never Used  Substance Use Topics  . Alcohol use: Yes    Comment: socially  . Drug use: No     Allergies   Codeine and Demerol [meperidine]   Review of Systems Review of Systems  Constitutional: Negative for fever.  Musculoskeletal: Positive for arthralgias.  Skin: Positive for wound.     Physical Exam Updated Vital Signs BP 115/74 (BP Location: Right Arm)   Pulse 82   Temp 98.5 F (36.9 C) (Oral)   Resp 20   SpO2 100%   Physical Exam Vitals signs and nursing note reviewed.  Constitutional:      General: She is not in acute distress.    Appearance: Normal appearance. She is well-developed.  HENT:     Head: Normocephalic and atraumatic.     Mouth/Throat:     Pharynx: No oropharyngeal exudate.  Eyes:     Pupils: Pupils are equal, round, and reactive to light.  Neck:     Musculoskeletal: Normal range of  motion.  Cardiovascular:     Rate and Rhythm: Regular rhythm.     Heart sounds: Normal heart sounds.  Pulmonary:     Effort: Pulmonary effort is normal. No respiratory distress.     Breath sounds: Normal breath sounds.  Abdominal:     General: Bowel sounds are normal. There is no distension.     Palpations: Abdomen is soft.     Tenderness: There is no abdominal tenderness.  Musculoskeletal:     Left hand: She exhibits decreased range of motion, tenderness, deformity, laceration and swelling. She exhibits normal capillary refill. Normal sensation noted. Decreased strength noted. She exhibits finger abduction and thumb/finger  opposition. She exhibits no wrist extension trouble.       Hands:     Right lower leg: No edema.     Left lower leg: No edema.  Skin:    General: Skin is warm and dry.  Neurological:     Mental Status: She is alert and oriented to person, place, and time.            ED Treatments / Results  Labs (all labs ordered are listed, but only abnormal results are displayed) Labs Reviewed - No data to display  EKG None  Radiology Dg Hand Complete Left  Result Date: 03/02/2019 CLINICAL DATA:  The patient suffered a laceration of the left hand on a glass door pane today. Initial encounter. EXAM: LEFT HAND - COMPLETE 3+ VIEW COMPARISON:  None. FINDINGS: There is no evidence of fracture or dislocation. There is no evidence of arthropathy or other focal bone abnormality. Soft tissues are unremarkable. IMPRESSION: Negative exam. Electronically Signed   By: Drusilla Kannerhomas  Dalessio M.D.   On: 03/02/2019 11:29    Procedures .Marland Kitchen.Laceration Repair  Date/Time: 03/02/2019 2:26 PM Performed by: Claude MangesSoto, Latacha Texeira, PA-C Authorized by: Claude MangesSoto, Karel Mowers, PA-C   Consent:    Consent obtained:  Verbal   Consent given by:  Patient   Risks discussed:  Infection, pain, poor cosmetic result, nerve damage and vascular damage   Alternatives discussed:  Delayed treatment Anesthesia (see MAR for  exact dosages):    Anesthesia method:  None Laceration details:    Location:  Finger   Finger location:  L thumb   Length (cm):  3   Depth (mm):  2 Repair type:    Repair type:  Simple Skin repair:    Repair method:  Sutures   Suture size:  4-0   Suture technique:  Simple interrupted   Number of sutures:  4 Approximation:    Approximation:  Close Post-procedure details:    Dressing:  Open (no dressing)   Patient tolerance of procedure:  Tolerated well, no immediate complications Comments:     Patient was placed on a dry dressing along with left thumb spica.   (including critical care time)  Medications Ordered in ED Medications  HYDROcodone-acetaminophen (NORCO/VICODIN) 5-325 MG per tablet 1 tablet (1 tablet Oral Given 03/02/19 1208)  ondansetron (ZOFRAN) tablet 4 mg (4 mg Oral Given 03/02/19 1208)  Tdap (BOOSTRIX) injection 0.5 mL (0.5 mLs Intramuscular Given 03/02/19 1209)     Initial Impression / Assessment and Plan / ED Course  I have reviewed the triage vital signs and the nursing notes.  Pertinent labs & imaging results that were available during my care of the patient were reviewed by me and considered in my medical decision making (see chart for details).       Patient with left hand laceration presents to the ED after putting her hand through the front door of her home and turned some glass.  Patient has limited range of motion upon examination, pulses present, capillary refill is intact.  She does have limited thumb opposition, reports unable to barely move her left thumb, extensive deep laceration noted to the left thumb base. X-ray obtained of her left hand without any foreign bodies, soft tissue gas.  Due to patient's limited range of motion along with decrease of thumb opposition will consult orthopedics for their recommendations.   2:00 PM Patient evaluated by orthopedic PA Charma IgoMichael jeffery pending consultation with Dr. Janee Mornhompson.   2:05 PM per Dr. Carollee Massedhompson's  recommendations patient is  to be closed at the skin while in the ED along with follow-up with him in office on Tuesday, September 22.  She will also be placed on a thumb spica.  Patient will go home on a short course of pain relief along with Zofran as she reports nausea with opioids.  With otherwise stable vital signs, updated tetanus vaccine during encounter.  Patient stable for discharge.  Patient was surgically repair by me, I placed 3 stitches to her left thumb with close approximation along with a dry dressing along with a thumb spica.  Portions of this note were generated with Scientist, clinical (histocompatibility and immunogenetics). Dictation errors may occur despite best attempts at proofreading.  Final Clinical Impressions(s) / ED Diagnoses   Final diagnoses:  Laceration of left hand without foreign body, initial encounter    ED Discharge Orders         Ordered    HYDROcodone-acetaminophen (NORCO/VICODIN) 5-325 MG tablet  Every 4 hours PRN     03/02/19 1404    ondansetron (ZOFRAN) 4 MG tablet  Every 6 hours     03/02/19 1404           Claude Manges, PA-C 03/02/19 1450    Pricilla Loveless, MD 03/02/19 1528

## 2019-03-02 NOTE — Progress Notes (Signed)
Orthopedic Tech Progress Note Patient Details:  Natalie Yang [birth year] [id number]  Ortho Devices Type of Ortho Device: Thumb spica splint Splint Material: Plaster Ortho Device/Splint Location: ULE Ortho Device/Splint Interventions: Adjustment, Application, Ordered   Post Interventions Patient Tolerated: Well Instructions Provided: Care of device, Adjustment of device   Janit Pagan 03/02/2019, 4:01 PM

## 2019-03-02 NOTE — Discharge Instructions (Signed)
I placed 4 sutures to your left thumb, your left hand was placed on a thumb spica, please keep this on until follow-up with Dr. Grandville Silos.  Dr. Biagio Borg name is attached to your chart, please schedule an appointment for further repair.

## 2019-03-02 NOTE — ED Triage Notes (Signed)
Pt to ER for left hand laceration. Extensive lac noted. Bleeding controlled. Sensation intact to left thumb. Bleeding controlled. ROM intact.

## 2019-06-18 ENCOUNTER — Other Ambulatory Visit: Payer: Self-pay | Admitting: Family Medicine

## 2019-06-18 DIAGNOSIS — Z0000 Encounter for general adult medical examination without abnormal findings: Secondary | ICD-10-CM

## 2019-07-31 ENCOUNTER — Ambulatory Visit
Admission: RE | Admit: 2019-07-31 | Discharge: 2019-07-31 | Disposition: A | Discharge status: Home / Self Care | Payer: BLUE CROSS/BLUE SHIELD | Source: Ambulatory Visit | Attending: Family Medicine | Admitting: Family Medicine

## 2019-07-31 ENCOUNTER — Other Ambulatory Visit: Payer: Self-pay

## 2019-07-31 DIAGNOSIS — Z0000 Encounter for general adult medical examination without abnormal findings: Secondary | ICD-10-CM

## 2019-12-10 ENCOUNTER — Other Ambulatory Visit: Payer: Self-pay | Admitting: Family Medicine

## 2019-12-10 ENCOUNTER — Ambulatory Visit
Admission: RE | Admit: 2019-12-10 | Discharge: 2019-12-10 | Disposition: A | Discharge status: Home / Self Care | Payer: BC Managed Care – PPO | Source: Ambulatory Visit | Attending: Family Medicine | Admitting: Family Medicine

## 2019-12-10 DIAGNOSIS — M545 Low back pain, unspecified: Secondary | ICD-10-CM

## 2019-12-10 DIAGNOSIS — M25552 Pain in left hip: Secondary | ICD-10-CM

## 2020-08-08 ENCOUNTER — Other Ambulatory Visit: Payer: Self-pay | Admitting: Family Medicine

## 2020-08-08 DIAGNOSIS — Z1231 Encounter for screening mammogram for malignant neoplasm of breast: Secondary | ICD-10-CM

## 2020-08-12 ENCOUNTER — Inpatient Hospital Stay: Admission: RE | Admit: 2020-08-12 | Payer: BC Managed Care – PPO | Source: Ambulatory Visit

## 2020-09-04 ENCOUNTER — Ambulatory Visit: Payer: BC Managed Care – PPO

## 2020-10-02 ENCOUNTER — Ambulatory Visit: Payer: BC Managed Care – PPO

## 2020-10-28 ENCOUNTER — Ambulatory Visit: Payer: BC Managed Care – PPO

## 2020-11-04 ENCOUNTER — Ambulatory Visit: Payer: BC Managed Care – PPO

## 2020-11-04 ENCOUNTER — Other Ambulatory Visit (HOSPITAL_COMMUNITY): Payer: Self-pay | Admitting: Chiropractic Medicine

## 2020-11-04 DIAGNOSIS — M5459 Other low back pain: Secondary | ICD-10-CM

## 2020-11-06 ENCOUNTER — Other Ambulatory Visit: Payer: Self-pay

## 2020-11-06 ENCOUNTER — Ambulatory Visit
Admission: RE | Admit: 2020-11-06 | Discharge: 2020-11-06 | Disposition: A | Discharge status: Home / Self Care | Payer: BC Managed Care – PPO | Source: Ambulatory Visit | Attending: Family Medicine | Admitting: Family Medicine

## 2020-11-06 ENCOUNTER — Ambulatory Visit (HOSPITAL_COMMUNITY)
Admission: RE | Admit: 2020-11-06 | Discharge: 2020-11-06 | Disposition: A | Discharge status: Home / Self Care | Payer: BC Managed Care – PPO | Source: Ambulatory Visit | Attending: Chiropractic Medicine | Admitting: Chiropractic Medicine

## 2020-11-06 DIAGNOSIS — Z1231 Encounter for screening mammogram for malignant neoplasm of breast: Secondary | ICD-10-CM

## 2020-11-06 DIAGNOSIS — M5459 Other low back pain: Secondary | ICD-10-CM | POA: Diagnosis present

## 2020-12-11 ENCOUNTER — Ambulatory Visit (HOSPITAL_COMMUNITY): Payer: BC Managed Care – PPO | Admitting: Anesthesiology

## 2020-12-11 ENCOUNTER — Encounter (HOSPITAL_COMMUNITY)
Admission: AD | Disposition: A | Discharge status: Home Health | Payer: Self-pay | Source: Ambulatory Visit | Attending: Specialist

## 2020-12-11 ENCOUNTER — Inpatient Hospital Stay (HOSPITAL_COMMUNITY)
Admission: AD | Admit: 2020-12-11 | Discharge: 2020-12-17 | DRG: 863 | Disposition: A | Discharge status: Home Health | Payer: BC Managed Care – PPO | Source: Ambulatory Visit | Attending: Specialist | Admitting: Specialist

## 2020-12-11 ENCOUNTER — Encounter (HOSPITAL_COMMUNITY): Payer: Self-pay | Admitting: Specialist

## 2020-12-11 DIAGNOSIS — Z87891 Personal history of nicotine dependence: Secondary | ICD-10-CM

## 2020-12-11 DIAGNOSIS — Z20822 Contact with and (suspected) exposure to covid-19: Secondary | ICD-10-CM | POA: Diagnosis present

## 2020-12-11 DIAGNOSIS — K219 Gastro-esophageal reflux disease without esophagitis: Secondary | ICD-10-CM | POA: Diagnosis present

## 2020-12-11 DIAGNOSIS — Y838 Other surgical procedures as the cause of abnormal reaction of the patient, or of later complication, without mention of misadventure at the time of the procedure: Secondary | ICD-10-CM | POA: Diagnosis present

## 2020-12-11 DIAGNOSIS — T8140XA Infection following a procedure, unspecified, initial encounter: Principal | ICD-10-CM | POA: Diagnosis present

## 2020-12-11 DIAGNOSIS — Z79899 Other long term (current) drug therapy: Secondary | ICD-10-CM

## 2020-12-11 DIAGNOSIS — M009 Pyogenic arthritis, unspecified: Secondary | ICD-10-CM | POA: Diagnosis present

## 2020-12-11 HISTORY — PX: INCISION AND DRAINAGE ABSCESS: SHX5864

## 2020-12-11 LAB — BASIC METABOLIC PANEL
Anion gap: 8 (ref 5–15)
BUN: 7 mg/dL (ref 6–20)
CO2: 26 mmol/L (ref 22–32)
Calcium: 8.4 mg/dL — ABNORMAL LOW (ref 8.9–10.3)
Chloride: 103 mmol/L (ref 98–111)
Creatinine, Ser: 0.55 mg/dL (ref 0.44–1.00)
GFR, Estimated: >60 mL/min (ref >60)
Glucose, Bld: 116 mg/dL — ABNORMAL HIGH (ref 70–99)
Potassium: 3.5 mmol/L (ref 3.5–5.1)
Sodium: 137 mmol/L (ref 135–145)

## 2020-12-11 LAB — CBC
HCT: 42.2 % (ref 36.0–46.0)
HCT: 37.8 % (ref 36.0–46.0)
Hemoglobin: 13.6 g/dL (ref 12.0–15.0)
Hemoglobin: 12.1 g/dL (ref 12.0–15.0)
MCH: 32.5 pg (ref 26.0–34.0)
MCH: 32.6 pg (ref 26.0–34.0)
MCHC: 32.2 g/dL (ref 30.0–36.0)
MCHC: 32 g/dL (ref 30.0–36.0)
MCV: 101 fL — ABNORMAL HIGH (ref 80.0–100.0)
MCV: 101.9 fL — ABNORMAL HIGH (ref 80.0–100.0)
Platelets: 318 10*3/uL (ref 150–400)
Platelets: 252 10*3/uL (ref 150–400)
RBC: 4.18 MIL/uL (ref 3.87–5.11)
RBC: 3.71 MIL/uL — ABNORMAL LOW (ref 3.87–5.11)
RDW: 13.8 % (ref 11.5–15.5)
RDW: 13.8 % (ref 11.5–15.5)
WBC: 9.8 10*3/uL (ref 4.0–10.5)
WBC: 8.3 10*3/uL (ref 4.0–10.5)
nRBC: 0 % (ref 0.0–0.2)
nRBC: 0 % (ref 0.0–0.2)

## 2020-12-11 LAB — TYPE AND SCREEN
ABO/RH(D): O POS
Antibody Screen: NEGATIVE

## 2020-12-11 LAB — SARS CORONAVIRUS 2 BY RT PCR (HOSPITAL ORDER, PERFORMED IN ~~LOC~~ HOSPITAL LAB): SARS Coronavirus 2: NEGATIVE

## 2020-12-11 LAB — COMPREHENSIVE METABOLIC PANEL
ALT: 18 U/L (ref 0–44)
AST: 23 U/L (ref 15–41)
Albumin: 3.9 g/dL (ref 3.5–5.0)
Alkaline Phosphatase: 68 U/L (ref 38–126)
Anion gap: 9 (ref 5–15)
BUN: 11 mg/dL (ref 6–20)
CO2: 26 mmol/L (ref 22–32)
Calcium: 8.7 mg/dL — ABNORMAL LOW (ref 8.9–10.3)
Chloride: 102 mmol/L (ref 98–111)
Creatinine, Ser: 0.55 mg/dL (ref 0.44–1.00)
GFR, Estimated: >60 mL/min (ref >60)
Glucose, Bld: 108 mg/dL — ABNORMAL HIGH (ref 70–99)
Potassium: 3.3 mmol/L — ABNORMAL LOW (ref 3.5–5.1)
Sodium: 137 mmol/L (ref 135–145)
Total Bilirubin: 0.4 mg/dL (ref 0.3–1.2)
Total Protein: 7 g/dL (ref 6.5–8.1)

## 2020-12-11 LAB — ABO/RH: ABO/RH(D): O POS

## 2020-12-11 SURGERY — INCISION AND DRAINAGE, ABSCESS
Anesthesia: General | Site: Knee | Laterality: Right

## 2020-12-11 MED ORDER — METHOCARBAMOL 500 MG PO TABS
500.0000 mg | ORAL_TABLET | Freq: Four times a day (QID) | ORAL | Status: DC | PRN
  Administered 2020-12-12 – 2020-12-17 (×11): 500 mg via ORAL
  Filled 2020-12-11 (×11): qty 1

## 2020-12-11 MED ORDER — ONDANSETRON HCL 4 MG/2ML IJ SOLN
INTRAMUSCULAR | Status: AC
  Filled 2020-12-11: qty 2

## 2020-12-11 MED ORDER — MIDAZOLAM HCL 2 MG/2ML IJ SOLN
INTRAMUSCULAR | Status: AC
  Filled 2020-12-11: qty 2

## 2020-12-11 MED ORDER — ACYCLOVIR 200 MG PO CAPS
200.0000 mg | ORAL_CAPSULE | Freq: Every day | ORAL | Status: DC
  Administered 2020-12-12 – 2020-12-17 (×5): 200 mg via ORAL
  Filled 2020-12-11 (×6): qty 1

## 2020-12-11 MED ORDER — AMPHETAMINE-DEXTROAMPHET ER 30 MG PO CP24: 30.0000 mg | ORAL_CAPSULE | Freq: Two times a day (BID) | ORAL | Status: DC

## 2020-12-11 MED ORDER — HYDROMORPHONE HCL 1 MG/ML IJ SOLN
INTRAMUSCULAR | Status: DC | PRN
  Administered 2020-12-11 (×2): 1 mg via INTRAVENOUS

## 2020-12-11 MED ORDER — MOMETASONE FURO-FORMOTEROL FUM 100-5 MCG/ACT IN AERO
2.0000 | INHALATION_SPRAY | Freq: Two times a day (BID) | RESPIRATORY_TRACT | Status: DC
  Administered 2020-12-12 – 2020-12-17 (×12): 2 via RESPIRATORY_TRACT
  Filled 2020-12-11: qty 8.8

## 2020-12-11 MED ORDER — BUSPIRONE HCL 10 MG PO TABS: 30.0000 mg | ORAL_TABLET | Freq: Two times a day (BID) | ORAL | Status: DC | PRN

## 2020-12-11 MED ORDER — CHLORHEXIDINE GLUCONATE 0.12 % MT SOLN
15.0000 mL | Freq: Once | OROMUCOSAL | Status: AC
  Administered 2020-12-11: 15 mL via OROMUCOSAL

## 2020-12-11 MED ORDER — KETOROLAC TROMETHAMINE 30 MG/ML IJ SOLN
30.0000 mg | Freq: Once | INTRAMUSCULAR | Status: AC
  Administered 2020-12-11: 30 mg via INTRAVENOUS
  Filled 2020-12-11: qty 1

## 2020-12-11 MED ORDER — OXYMETAZOLINE HCL 0.05 % NA SOLN
1.0000 | Freq: Two times a day (BID) | NASAL | Status: AC
  Administered 2020-12-12 – 2020-12-13 (×2): 1 via NASAL
  Filled 2020-12-11: qty 15

## 2020-12-11 MED ORDER — LORATADINE 10 MG PO TABS
10.0000 mg | ORAL_TABLET | Freq: Every day | ORAL | Status: DC
  Administered 2020-12-12 – 2020-12-17 (×5): 10 mg via ORAL
  Filled 2020-12-11 (×6): qty 1

## 2020-12-11 MED ORDER — FENTANYL CITRATE (PF) 100 MCG/2ML IJ SOLN
50.0000 ug | INTRAMUSCULAR | Status: AC
Start: 2020-12-11 — End: 2020-12-11
  Administered 2020-12-11 (×2): 50 ug via INTRAVENOUS
  Filled 2020-12-11: qty 2

## 2020-12-11 MED ORDER — MIDAZOLAM HCL 2 MG/2ML IJ SOLN
INTRAMUSCULAR | Status: DC | PRN
  Administered 2020-12-11: 2 mg via INTRAVENOUS

## 2020-12-11 MED ORDER — FENTANYL CITRATE (PF) 250 MCG/5ML IJ SOLN
INTRAMUSCULAR | Status: AC
  Filled 2020-12-11: qty 5

## 2020-12-11 MED ORDER — PROPOFOL 10 MG/ML IV BOLUS
INTRAVENOUS | Status: AC
  Filled 2020-12-11: qty 20

## 2020-12-11 MED ORDER — HYDROMORPHONE HCL 1 MG/ML IJ SOLN
INTRAMUSCULAR | Status: AC
  Filled 2020-12-11: qty 1

## 2020-12-11 MED ORDER — AMISULPRIDE (ANTIEMETIC) 5 MG/2ML IV SOLN
INTRAVENOUS | Status: AC
  Filled 2020-12-11: qty 2

## 2020-12-11 MED ORDER — ORAL CARE MOUTH RINSE: 15.0000 mL | Freq: Once | OROMUCOSAL | Status: AC

## 2020-12-11 MED ORDER — PHENYLEPHRINE HCL (PRESSORS) 10 MG/ML IV SOLN
INTRAVENOUS | Status: AC
  Filled 2020-12-11: qty 1

## 2020-12-11 MED ORDER — SUCCINYLCHOLINE CHLORIDE 200 MG/10ML IV SOSY
PREFILLED_SYRINGE | INTRAVENOUS | Status: AC
  Filled 2020-12-11: qty 10

## 2020-12-11 MED ORDER — DEXAMETHASONE SODIUM PHOSPHATE 10 MG/ML IJ SOLN
INTRAMUSCULAR | Status: AC
  Filled 2020-12-11: qty 1

## 2020-12-11 MED ORDER — ONDANSETRON HCL 4 MG/2ML IJ SOLN
INTRAMUSCULAR | Status: DC | PRN
  Administered 2020-12-11 (×2): 4 mg via INTRAVENOUS

## 2020-12-11 MED ORDER — ACETAMINOPHEN 10 MG/ML IV SOLN: 1000.0000 mg | Freq: Once | INTRAVENOUS | Status: DC

## 2020-12-11 MED ORDER — LACTATED RINGERS IV SOLN: INTRAVENOUS | Status: DC

## 2020-12-11 MED ORDER — METHOCARBAMOL 500 MG IVPB - SIMPLE MED
500.0000 mg | Freq: Four times a day (QID) | INTRAVENOUS | Status: DC | PRN
  Administered 2020-12-11: 500 mg via INTRAVENOUS
  Filled 2020-12-11: qty 50

## 2020-12-11 MED ORDER — ALBUTEROL SULFATE (2.5 MG/3ML) 0.083% IN NEBU
3.0000 mL | INHALATION_SOLUTION | Freq: Four times a day (QID) | RESPIRATORY_TRACT | Status: DC | PRN
  Administered 2020-12-12: 3 mL via RESPIRATORY_TRACT
  Filled 2020-12-11: qty 3

## 2020-12-11 MED ORDER — DEXAMETHASONE SODIUM PHOSPHATE 10 MG/ML IJ SOLN
INTRAMUSCULAR | Status: AC
  Filled 2020-12-11: qty 2

## 2020-12-11 MED ORDER — LIDOCAINE 2% (20 MG/ML) 5 ML SYRINGE
INTRAMUSCULAR | Status: DC | PRN
  Administered 2020-12-11: 75 mg via INTRAVENOUS

## 2020-12-11 MED ORDER — BUPIVACAINE HCL (PF) 0.25 % IJ SOLN
INTRAMUSCULAR | Status: DC | PRN
  Administered 2020-12-11: 20 mL

## 2020-12-11 MED ORDER — CEFAZOLIN SODIUM-DEXTROSE 2-4 GM/100ML-% IV SOLN
2.0000 g | INTRAVENOUS | Status: DC
  Administered 2020-12-11: 2 g via INTRAVENOUS
  Filled 2020-12-11: qty 100

## 2020-12-11 MED ORDER — ONDANSETRON 4 MG PO TBDP
4.0000 mg | ORAL_TABLET | Freq: Three times a day (TID) | ORAL | Status: DC | PRN
  Administered 2020-12-13 – 2020-12-14 (×3): 4 mg via ORAL
  Filled 2020-12-11 (×3): qty 1

## 2020-12-11 MED ORDER — AMISULPRIDE (ANTIEMETIC) 5 MG/2ML IV SOLN
10.0000 mg | Freq: Once | INTRAVENOUS | Status: AC
  Administered 2020-12-11: 10 mg via INTRAVENOUS

## 2020-12-11 MED ORDER — SUMATRIPTAN SUCCINATE 25 MG PO TABS
100.0000 mg | ORAL_TABLET | ORAL | Status: DC | PRN
  Administered 2020-12-12 – 2020-12-17 (×10): 100 mg via ORAL
  Filled 2020-12-11 (×3): qty 2
  Filled 2020-12-11: qty 4
  Filled 2020-12-11 (×4): qty 2
  Filled 2020-12-11 (×3): qty 4
  Filled 2020-12-11: qty 2
  Filled 2020-12-11: qty 4
  Filled 2020-12-11 (×2): qty 2

## 2020-12-11 MED ORDER — ALBUTEROL SULFATE (2.5 MG/3ML) 0.083% IN NEBU
2.5000 mg | INHALATION_SOLUTION | RESPIRATORY_TRACT | Status: AC
  Administered 2020-12-11: 2.5 mg via RESPIRATORY_TRACT
  Filled 2020-12-11: qty 3

## 2020-12-11 MED ORDER — GABAPENTIN 300 MG PO CAPS
300.0000 mg | ORAL_CAPSULE | Freq: Every day | ORAL | Status: DC
  Administered 2020-12-12 – 2020-12-16 (×6): 300 mg via ORAL
  Filled 2020-12-11 (×6): qty 1

## 2020-12-11 MED ORDER — HYDROMORPHONE HCL 2 MG/ML IJ SOLN
INTRAMUSCULAR | Status: AC
  Filled 2020-12-11: qty 1

## 2020-12-11 MED ORDER — PROPOFOL 10 MG/ML IV BOLUS
INTRAVENOUS | Status: DC | PRN
  Administered 2020-12-11: 200 mg via INTRAVENOUS
  Administered 2020-12-11: 30 mg via INTRAVENOUS

## 2020-12-11 MED ORDER — BUPIVACAINE HCL 0.25 % IJ SOLN
INTRAMUSCULAR | Status: AC
  Filled 2020-12-11: qty 1

## 2020-12-11 MED ORDER — HYDROXYZINE HCL 50 MG PO TABS
50.0000 mg | ORAL_TABLET | Freq: Four times a day (QID) | ORAL | Status: DC | PRN
  Filled 2020-12-11: qty 3

## 2020-12-11 MED ORDER — APREPITANT 40 MG PO CAPS
40.0000 mg | ORAL_CAPSULE | Freq: Once | ORAL | Status: AC
  Administered 2020-12-11: 40 mg via ORAL
  Filled 2020-12-11: qty 1

## 2020-12-11 MED ORDER — HYDROMORPHONE HCL 1 MG/ML IJ SOLN
0.2500 mg | INTRAMUSCULAR | Status: DC | PRN
  Administered 2020-12-11 (×3): 0.25 mg via INTRAVENOUS
  Administered 2020-12-11 (×2): 0.5 mg via INTRAVENOUS

## 2020-12-11 MED ORDER — BUTALBITAL-APAP-CAFFEINE 50-325-40 MG PO TABS
1.0000 | ORAL_TABLET | Freq: Two times a day (BID) | ORAL | Status: DC | PRN
  Administered 2020-12-12 – 2020-12-14 (×2): 1 via ORAL
  Filled 2020-12-11 (×2): qty 1

## 2020-12-11 MED ORDER — GLYCERIN-HYPROMELLOSE-PEG 400 0.2-0.2-1 % OP SOLN: 1.0000 [drp] | Freq: Every day | OPHTHALMIC | Status: DC | PRN

## 2020-12-11 MED ORDER — MELATONIN 5 MG PO TABS
10.0000 mg | ORAL_TABLET | Freq: Every day | ORAL | Status: DC
  Administered 2020-12-12 – 2020-12-16 (×6): 10 mg via ORAL
  Filled 2020-12-11 (×6): qty 2

## 2020-12-11 MED ORDER — SCOPOLAMINE 1 MG/3DAYS TD PT72
1.0000 | MEDICATED_PATCH | TRANSDERMAL | Status: DC
  Administered 2020-12-11: 1.5 mg via TRANSDERMAL
  Filled 2020-12-11: qty 1

## 2020-12-11 MED ORDER — DEXMEDETOMIDINE (PRECEDEX) IN NS 20 MCG/5ML (4 MCG/ML) IV SYRINGE
PREFILLED_SYRINGE | INTRAVENOUS | Status: DC | PRN
  Administered 2020-12-11 (×2): 8 ug via INTRAVENOUS
  Administered 2020-12-11: 4 ug via INTRAVENOUS

## 2020-12-11 MED ORDER — VENLAFAXINE HCL ER 37.5 MG PO CP24
37.5000 mg | ORAL_CAPSULE | Freq: Every day | ORAL | Status: DC
  Administered 2020-12-12: 37.5 mg via ORAL
  Filled 2020-12-11 (×2): qty 1

## 2020-12-11 MED ORDER — METHOCARBAMOL 500 MG IVPB - SIMPLE MED
INTRAVENOUS | Status: AC
  Filled 2020-12-11: qty 50

## 2020-12-11 MED ORDER — CEFAZOLIN SODIUM-DEXTROSE 2-4 GM/100ML-% IV SOLN
2.0000 g | Freq: Three times a day (TID) | INTRAVENOUS | Status: DC
  Administered 2020-12-12: 2 g via INTRAVENOUS
  Filled 2020-12-11: qty 100

## 2020-12-11 MED ORDER — EPHEDRINE 5 MG/ML INJ
INTRAVENOUS | Status: AC
  Filled 2020-12-11: qty 10

## 2020-12-11 MED ORDER — TRAZODONE HCL 100 MG PO TABS: 100.0000 mg | ORAL_TABLET | Freq: Every evening | ORAL | Status: DC | PRN

## 2020-12-11 MED ORDER — LIDOCAINE 2% (20 MG/ML) 5 ML SYRINGE
INTRAMUSCULAR | Status: AC
  Filled 2020-12-11: qty 5

## 2020-12-11 MED ORDER — PROPOFOL 500 MG/50ML IV EMUL
INTRAVENOUS | Status: DC | PRN
  Administered 2020-12-11: 175 ug/kg/min via INTRAVENOUS

## 2020-12-11 MED ORDER — SODIUM CHLORIDE 0.9 % IR SOLN
Status: DC | PRN
  Administered 2020-12-11: 9000 mL

## 2020-12-11 MED ORDER — FENTANYL CITRATE (PF) 250 MCG/5ML IJ SOLN
INTRAMUSCULAR | Status: DC | PRN
  Administered 2020-12-11: 100 ug via INTRAVENOUS
  Administered 2020-12-11: 50 ug via INTRAVENOUS
  Administered 2020-12-11 (×3): 100 ug via INTRAVENOUS
  Administered 2020-12-11: 50 ug via INTRAVENOUS

## 2020-12-11 MED ORDER — ACETAMINOPHEN 500 MG PO TABS
1000.0000 mg | ORAL_TABLET | Freq: Once | ORAL | Status: AC
  Administered 2020-12-11: 1000 mg via ORAL
  Filled 2020-12-11: qty 2

## 2020-12-11 MED ORDER — CELECOXIB 200 MG PO CAPS
200.0000 mg | ORAL_CAPSULE | Freq: Once | ORAL | Status: AC
  Administered 2020-12-11: 200 mg via ORAL
  Filled 2020-12-11: qty 1

## 2020-12-11 SURGICAL SUPPLY — 45 items
BAG COUNTER SPONGE SURGICOUNT (BAG) ×2 IMPLANT
BAG SPNG CNTER NS LX DISP (BAG) ×1
BANDAGE ESMARK 6X9 LF (GAUZE/BANDAGES/DRESSINGS) ×1 IMPLANT
BLADE EXCALIBUR 4.0X13 (MISCELLANEOUS) ×2 IMPLANT
BNDG CMPR 9X6 STRL LF SNTH (GAUZE/BANDAGES/DRESSINGS) ×1
BNDG CMPR MED 10X6 ELC LF (GAUZE/BANDAGES/DRESSINGS) ×1
BNDG ELASTIC 6X10 VLCR STRL LF (GAUZE/BANDAGES/DRESSINGS) ×1 IMPLANT
BNDG ESMARK 6X9 LF (GAUZE/BANDAGES/DRESSINGS) ×2
BNDG GAUZE ELAST 4 BULKY (GAUZE/BANDAGES/DRESSINGS) ×2 IMPLANT
CANISTER SUCT 3000ML PPV (MISCELLANEOUS) ×2 IMPLANT
CNTNR URN SCR LID CUP LEK RST (MISCELLANEOUS) IMPLANT
CONT SPEC 4OZ STRL OR WHT (MISCELLANEOUS)
CUFF TOURN SGL QUICK 34 (TOURNIQUET CUFF) ×4
CUFF TRNQT CYL 34X4.125X (TOURNIQUET CUFF) ×2 IMPLANT
DRAPE ARTHROSCOPY W/POUCH 114 (DRAPES) ×2 IMPLANT
DRAPE INCISE IOBAN 66X45 STRL (DRAPES) ×2 IMPLANT
DRAPE U-SHAPE 47X51 STRL (DRAPES) ×2 IMPLANT
DRSG PAD ABDOMINAL 8X10 ST (GAUZE/BANDAGES/DRESSINGS) ×1 IMPLANT
DURAPREP 26ML APPLICATOR (WOUND CARE) ×2 IMPLANT
DW OUTFLOW CASSETTE/TUBE SET (MISCELLANEOUS) ×1 IMPLANT
EXCALIBUR 3.8MM X 13CM (MISCELLANEOUS) ×2 IMPLANT
GAUZE SPONGE 4X4 12PLY STRL (GAUZE/BANDAGES/DRESSINGS) ×2 IMPLANT
GAUZE XEROFORM 1X8 LF (GAUZE/BANDAGES/DRESSINGS) ×2 IMPLANT
GLOVE SURG ENC MOIS LTX SZ7 (GLOVE) ×2 IMPLANT
GLOVE SURG ENC MOIS LTX SZ8 (GLOVE) ×2 IMPLANT
GLOVE SURG UNDER LTX SZ8 (GLOVE) ×2 IMPLANT
GLOVE SURG UNDER POLY LF SZ7.5 (GLOVE) ×2 IMPLANT
GOWN STRL REUS W/TWL XL LVL3 (GOWN DISPOSABLE) ×4 IMPLANT
IMMOBILIZER KNEE 20 (SOFTGOODS) ×2 IMPLANT
IMMOBILIZER KNEE 20 THIGH 36 (SOFTGOODS) IMPLANT
IV NS IRRIG 3000ML ARTHROMATIC (IV SOLUTION) ×4 IMPLANT
KIT BASIN OR (CUSTOM PROCEDURE TRAY) ×2 IMPLANT
KIT TURNOVER KIT A (KITS) ×2 IMPLANT
MANIFOLD NEPTUNE II (INSTRUMENTS) ×2 IMPLANT
NEEDLE HYPO 22GX1.5 SAFETY (NEEDLE) ×2 IMPLANT
PACK ARTHROSCOPY DSU (CUSTOM PROCEDURE TRAY) ×2 IMPLANT
PAD ARMBOARD 7.5X6 YLW CONV (MISCELLANEOUS) IMPLANT
SUT ETHILON 4 0 PS 2 18 (SUTURE) ×2 IMPLANT
SWAB CULTURE ESWAB REG 1ML (MISCELLANEOUS) ×1 IMPLANT
SYR CONTROL 10ML LL (SYRINGE) ×2 IMPLANT
TOWEL OR 17X26 10 PK STRL BLUE (TOWEL DISPOSABLE) ×2 IMPLANT
TUBING ARTHROSCOPY IRRIG 16FT (MISCELLANEOUS) ×2 IMPLANT
TUBING CONNECTING 10 (TUBING) ×2 IMPLANT
WAND APOLLORF SJ50 AR-9845 (SURGICAL WAND) ×1 IMPLANT
WATER STERILE IRR 500ML POUR (IV SOLUTION) ×2 IMPLANT

## 2020-12-11 NOTE — Anesthesia Postprocedure Evaluation (Signed)
Anesthesia Post Note  Patient: Natalie Yang  Procedure(s) Performed: ARTHROSCOPIC INCISION AND DRAINAGE KNEE ABSCESS (Right: Knee)     Patient location during evaluation: PACU Anesthesia Type: General Level of consciousness: awake and alert Pain management: pain level controlled Vital Signs Assessment: post-procedure vital signs reviewed and stable Respiratory status: spontaneous breathing, nonlabored ventilation and respiratory function stable Cardiovascular status: blood pressure returned to baseline and stable Postop Assessment: no apparent nausea or vomiting Anesthetic complications: no   No notable events documented.  Last Vitals:  Vitals:   12/11/20 1645 12/11/20 1846  BP: 124/85 122/81  Pulse: 72 84  Resp: 16 16  Temp:  36.6 C  SpO2: 100% 100%           Cathan Gearin,W. EDMOND

## 2020-12-11 NOTE — Progress Notes (Signed)
Pharmacy Antibiotic Note  Natalie Yang is a 50 y.o. female admitted on 12/11/2020 with right knee infection s/p arthroscopy.  Pharmacy has been consulted for Cefazolin dosing.  Plan: Cefazolin 2g IV q8h Follow up renal function, culture results, and clinical course.   Height: 5\' 8"  (172.7 cm) Weight: 65.8 kg (145 lb) IBW/kg (Calculated) : 63.9  Temp (24hrs), Avg:98 F (36.7 C), Min:97.8 F (36.6 C), Max:98.4 F (36.9 C)  Recent Labs  Lab 12/11/20 1430  WBC 9.8  CREATININE 0.55    Estimated Creatinine Clearance: 83.9 mL/min (by C-G formula based on SCr of 0.55 mg/dL).    No Known Allergies  Antimicrobials this admission: 6/30 Cefazolin >>  Dose adjustments this admission:   Microbiology results: 6/29 outpatient knee aspiration   Thank you for allowing pharmacy to be a part of this patient's care.  7/29 PharmD, BCPS Clinical Pharmacist [facility] main pharmacy [phone] 12/11/2020 8:42 PM

## 2020-12-11 NOTE — Anesthesia Procedure Notes (Signed)
Procedure Name: Intubation Date/Time: 12/11/2020 8:32 PM Performed by: Lissa Morales, CRNA Pre-anesthesia Checklist: Patient identified, Emergency Drugs available, Suction available and Patient being monitored Patient Re-evaluated:Patient Re-evaluated prior to induction Oxygen Delivery Method: Circle system utilized Preoxygenation: Pre-oxygenation with 100% oxygen Induction Type: IV induction, Rapid sequence and Cricoid Pressure applied Laryngoscope Size: Mac and 4 Grade View: Grade I Tube type: Oral Tube size: 7.0 mm Number of attempts: 1 Airway Equipment and Method: Stylet and Oral airway Placement Confirmation: ETT inserted through vocal cords under direct vision, positive ETCO2 and breath sounds checked- equal and bilateral Secured at: 22 cm Tube secured with: Tape Dental Injury: Teeth and Oropharynx as per pre-operative assessment

## 2020-12-11 NOTE — Interval H&P Note (Signed)
History and Physical Interval Note:  12/11/2020 8:20 PM  Natalie Yang  has presented today for surgery, with the diagnosis of right knee septic arthrosis.  The various methods of treatment have been discussed with the patient and family. After consideration of risks, benefits and other options for treatment, the patient has consented to  Procedure(s): INCISION AND DRAINAGE ABSCESS (Right) as a surgical intervention.  The patient's history has been reviewed, patient examined, no change in status, stable for surgery.  I have reviewed the patient's chart and labs.  Questions were answered to the patient's satisfaction.     Linnaea Ahn ANDREW

## 2020-12-11 NOTE — H&P (View-Only) (Signed)
Pt up to BR to void. Very SOB and states her "lungs are very tight."  She uses albuterol inhaler but is out. Notified Dr Fitzgerald . Orders received. 

## 2020-12-11 NOTE — Transfer of Care (Signed)
Immediate Anesthesia Transfer of Care Note  Patient: Natalie Yang  Procedure(s) Performed: ARTHROSCOPIC INCISION AND DRAINAGE KNEE ABSCESS (Right: Knee)  Patient Location: PACU  Anesthesia Type:General  Level of Consciousness: awake, alert , oriented and patient cooperative  Airway & Oxygen Therapy: Patient Spontanous Breathing and Patient connected to face mask oxygen  Post-op Assessment: Report given to RN, Post -op Vital signs reviewed and stable and Patient moving all extremities X 4  Post vital signs: stable  Last Vitals:  Vitals Value Taken Time  BP 133/81 12/11/20 2125  Temp    Pulse 79 12/11/20 2127  Resp 17 12/11/20 2127  SpO2 100 % 12/11/20 2127  Vitals shown include unvalidated device data.  Last Pain:  Vitals:   12/11/20 1846  TempSrc: Oral  PainSc: 10-Worst pain ever      Patients Stated Pain Goal: 4 (12/11/20 1846)  Complications: No notable events documented.

## 2020-12-11 NOTE — Op Note (Signed)
Preop diagnosis right knee septic arthritis Postoperative diagnosis same Procedure right knee arthroscopic irrigation and debridement obtain intraoperative cultures and fluid analysis Surgeon Valma Cava, MD Assistant Harl Favor, PA-C Anesthesia General Estimated blood loss minimal Drains 1 medium Hemovac Tourniquet time none Disposition stable Complications none Findings approximately 30 cc thick yellow purulent fluid was removed and sent for gram stain culture cell count differential and crystal analysis intraoperatively fibrinous exudate was present but minimal synovitis  Operative details patient was counseled holding a correct side marked and signed appropriately take the operating room placed supine position under general anesthesia vital extremity elevated prepped DuraPrep draped in sterile fashion after timeout the inferomedial inferolateral portals were opened medial removed was approximately 30 cc of thick yellowish purulent fluid sent for studies as noted above scope was then dosimetric and mechanical shaver abdomen neck meticulous debridement of all fibrinous exudate and synovitic areas copious irrigation the joint with over 9 L of fluid influence clear.  1 portal closed with nylon medium Hemovac drain was placed to develop portal sutured in place 20 cc of Sensorcaine was placed into the knee joint lab to soak 15 minutes for drain was opened also 10 cc was placed into the skin pressure wrap knee immobilizer was applied.  She was then awakened to the operating PACU stable condition.  No complications or problems.  Tomorrow will obtain PICC line request and also infectious disease consult office micro microbiologic studies are still pending even if this comes back culture negative the clinical presentation is that of septic arthritis.

## 2020-12-11 NOTE — Anesthesia Preprocedure Evaluation (Addendum)
Anesthesia Evaluation  Patient identified by MRN, date of birth, ID band Patient awake    Reviewed: Allergy & Precautions, H&P , NPO status , Patient's Chart, lab work & pertinent test results  History of Anesthesia Complications (+) PONV  Airway Mallampati: II  TM Distance: >3 FB Neck ROM: Full    Dental no notable dental hx. (+) Teeth Intact, Dental Advisory Given   Pulmonary asthma , former smoker,    Pulmonary exam normal breath sounds clear to auscultation       Cardiovascular negative cardio ROS   Rhythm:Regular Rate:Normal     Neuro/Psych  Headaches, negative psych ROS   GI/Hepatic Neg liver ROS, GERD  Medicated,  Endo/Other  negative endocrine ROS  Renal/GU negative Renal ROS  negative genitourinary   Musculoskeletal   Abdominal   Peds  Hematology negative hematology ROS (+)   Anesthesia Other Findings   Reproductive/Obstetrics negative OB ROS                            Anesthesia Physical Anesthesia Plan  ASA: 2  Anesthesia Plan: General   Post-op Pain Management:    Induction: Intravenous  PONV Risk Score and Plan: 4 or greater and Ondansetron, Dexamethasone and Midazolam  Airway Management Planned: LMA  Additional Equipment:   Intra-op Plan:   Post-operative Plan: Extubation in OR  Informed Consent: I have reviewed the patients History and Physical, chart, labs and discussed the procedure including the risks, benefits and alternatives for the proposed anesthesia with the patient or authorized representative who has indicated his/her understanding and acceptance.     Dental advisory given  Plan Discussed with: CRNA  Anesthesia Plan Comments:         Anesthesia Quick Evaluation

## 2020-12-11 NOTE — H&P (Signed)
Natalie Yang is an 50 y.o. female.   Chief Complaint: Right knee infection/ pain HPI: This is a very pleasant 50 year old female who is 3-week status post right knee arthroscopy who presented to the office yesterday with significant knee pain, sizable effusion and drainage from portal site.  The knee was aspirated while in the office it was aspirated as cloudy fluid.  It was sent off for culture, cultures are not back yet.  She is having difficulty with range of motion, significant pain in the knee.  She has tried anti-inflammatories as well as icing with minimal relief.  Pain started on Monday about 3 to 4 days after sutures removed.  Past Medical History:  Diagnosis Date   Asthma    GERD (gastroesophageal reflux disease)    Headache    migraines   PONV (postoperative nausea and vomiting)    Vaginal delivery 1997    Past Surgical History:  Procedure Laterality Date   BLADDER SUSPENSION N/A 08/30/2014   Procedure: TRANSVAGINAL TAPE (TVT) PROCEDURE;  Surgeon: Lavina Hamman, MD;  Location: [facility];  Service: Gynecology;  Laterality: N/A;   CHOLECYSTECTOMY     CYSTOSCOPY N/A 08/30/2014   Procedure: CYSTOSCOPY;  Surgeon: Lavina Hamman, MD;  Location: [facility];  Service: Gynecology;  Laterality: N/A;   HYSTEROSCOPY WITH NOVASURE N/A 08/30/2014   Procedure: HYSTEROSCOPY WITH NOVASURE;  Surgeon: Lavina Hamman, MD;  Location: [facility];  Service: Gynecology;  Laterality: N/A;  Dr only needs 1 1/2hrs OR time   IUD REMOVAL N/A 08/30/2014   Procedure: INTRAUTERINE DEVICE (IUD) REMOVAL;  Surgeon: Lavina Hamman, MD;  Location: [facility];  Service: Gynecology;  Laterality: N/A;   KNEE SURGERY     LAPAROSCOPIC TUBAL LIGATION Bilateral 08/30/2014   Procedure: LAPAROSCOPIC TUBAL LIGATION;  Surgeon: Lavina Hamman, MD;  Location: [facility];  Service: Gynecology;  Laterality: Bilateral;   NOSE SURGERY     x 5   uterine ablation      History reviewed. No pertinent family history. Social History:  reports that  she has quit smoking. She has never used smokeless tobacco. She reports current alcohol use. She reports that she does not use drugs.  Allergies: No Known Allergies  Medications Prior to Admission  Medication Sig Dispense Refill   acyclovir (ZOVIRAX) 200 MG capsule Take 200 mg by mouth daily.     albuterol (PROVENTIL HFA;VENTOLIN HFA) 108 (90 BASE) MCG/ACT inhaler Inhale 1-2 puffs into the lungs every 6 (six) hours as needed for wheezing or shortness of breath.     amphetamine-dextroamphetamine (ADDERALL XR) 30 MG 24 hr capsule Take 30 mg by mouth 2 (two) times daily.     busPIRone (BUSPAR) 30 MG tablet Take 30 mg by mouth 2 (two) times daily as needed for anxiety.     butalbital-acetaminophen-caffeine (FIORICET, ESGIC) 50-325-40 MG per tablet Take 1 tablet by mouth 2 (two) times daily as needed for headache or migraine.      cetirizine (ZYRTEC) 10 MG tablet Take 10 mg by mouth daily.     desvenlafaxine (PRISTIQ) 100 MG 24 hr tablet Take 200 mg by mouth daily.     Echinacea-Goldenseal (ECHINACEA COMB/GOLDEN SEAL PO) Take 1 tablet by mouth daily.     estradiol-norethindrone (ACTIVELLA) 1-0.5 MG tablet Take 1 tablet by mouth daily.     fluticasone-salmeterol (ADVAIR) 100-50 MCG/ACT AEPB Inhale 1 puff into the lungs 2 (two) times daily.     gabapentin (NEURONTIN) 300 MG capsule Take 300 mg by mouth at bedtime.  Glycerin-Hypromellose-PEG 400 (DRY EYE RELIEF DROPS) 0.2-0.2-1 % SOLN Place 1 drop into both eyes daily as needed (dry eyes).     hydrOXYzine (ATARAX/VISTARIL) 50 MG tablet Take 50-150 mg by mouth every 6 (six) hours as needed for anxiety or itching.     ibuprofen (ADVIL,MOTRIN) 800 MG tablet Take 800 mg by mouth at bedtime as needed (back pain).     Melatonin 10 MG TABS Take 10 mg by mouth at bedtime.     ondansetron (ZOFRAN ODT) 4 MG disintegrating tablet Take 1 tablet (4 mg total) by mouth every 8 (eight) hours as needed for nausea or vomiting. 20 tablet 0   oxyCODONE (OXY  IR/ROXICODONE) 5 MG immediate release tablet Take 5 mg by mouth every 6 (six) hours as needed for pain.     oxyCODONE-acetaminophen (PERCOCET) 10-325 MG tablet Take 1 tablet by mouth 3 (three) times daily.     oxymetazoline (AFRIN 12 HOUR) 0.05 % nasal spray Place 1 spray into both nostrils 2 (two) times daily.     SUMAtriptan (IMITREX) 100 MG tablet Take 100 mg by mouth every 2 (two) hours as needed for migraine. May repeat in 2 hours if headache persists or recurs.     traZODone (DESYREL) 100 MG tablet Take 100 mg by mouth at bedtime as needed for sleep.     triamcinolone cream (KENALOG) 0.5 % Apply 1 application topically at bedtime as needed (dry skin on hands).      Results for orders placed or performed during the hospital encounter of 12/11/20 (from the past 48 hour(s))  SARS Coronavirus 2 by RT PCR (hospital order, performed in [facility]) Nasopharyngeal Nasopharyngeal Swab     Status: None   Collection Time: 12/11/20  1:49 PM   Specimen: Nasopharyngeal Swab  Result Value Ref Range   SARS Coronavirus 2 NEGATIVE NEGATIVE    Comment: (NOTE) SARS-CoV-2 target nucleic acids are NOT DETECTED.  The SARS-CoV-2 RNA is generally detectable in upper and lower respiratory specimens during the acute phase of infection. The lowest concentration of SARS-CoV-2 viral copies this assay can detect is 250 copies / mL. A negative result does not preclude SARS-CoV-2 infection and should not be used as the sole basis for treatment or other patient management decisions.  A negative result may occur with improper specimen collection / handling, submission of specimen other than nasopharyngeal swab, presence of viral mutation(s) within the areas targeted by this assay, and inadequate number of viral copies (<250 copies / mL). A negative result must be combined with clinical observations, patient history, and epidemiological information.  Fact Sheet for Patients:    BoilerBrush.com.cyhttps://www.fda.gov/media/136312/download  Fact Sheet for Healthcare Providers: https://pope.com/https://www.fda.gov/media/136313/download  This test is not yet approved or  cleared by the [place] FDA and has been authorized for detection and/or diagnosis of SARS-CoV-2 by FDA under an Emergency Use Authorization (EUA).  This EUA will remain in effect (meaning this test can be used) for the duration of the COVID-19 declaration under Section 564(b)(1) of the Act, 21 U.S.C. section 360bbb-3(b)(1), unless the authorization is terminated or revoked sooner.  Performed at [facility], [address] [place], [place] [address]   CBC     Status: Abnormal   Collection Time: 12/11/20  2:30 PM  Result Value Ref Range   WBC 9.8 4.0 - 10.5 K/uL   RBC 4.18 3.87 - 5.11 MIL/uL   Hemoglobin 13.6 12.0 - 15.0 g/dL   HCT 98.142.2 19.136.0 - 47.846.0 %  MCV 101.0 (H) 80.0 - 100.0 fL   MCH 32.5 26.0 - 34.0 pg   MCHC 32.2 30.0 - 36.0 g/dL   RDW 87.5 64.3 - 32.9 %   Platelets 318 150 - 400 K/uL   nRBC 0.0 0.0 - 0.2 %    Comment: Performed at [facility], [address], [place], [place] [address]  Comprehensive metabolic panel     Status: Abnormal   Collection Time: 12/11/20  2:30 PM  Result Value Ref Range   Sodium 137 135 - 145 mmol/L   Potassium 3.3 (L) 3.5 - 5.1 mmol/L   Chloride 102 98 - 111 mmol/L   CO2 26 22 - 32 mmol/L   Glucose, Bld 108 (H) 70 - 99 mg/dL    Comment: Glucose reference range applies only to samples taken after fasting for at least 8 hours.   BUN 11 6 - 20 mg/dL   Creatinine, Ser 1.66 0.44 - 1.00 mg/dL   Calcium 8.7 (L) 8.9 - 10.3 mg/dL   Total Protein 7.0 6.5 - 8.1 g/dL   Albumin 3.9 3.5 - 5.0 g/dL   AST 23 15 - 41 U/L   ALT 18 0 - 44 U/L   Alkaline Phosphatase 68 38 - 126 U/L   Total Bilirubin 0.4 0.3 - 1.2 mg/dL   GFR, Estimated >06 >30 mL/min    Comment: (NOTE) Calculated using the CKD-EPI Creatinine Equation (2021)    Anion gap 9 5 - 15     Comment: Performed at [facility], [address] [place], [place] [address]  Type and screen Order type and screen if day of surgery is less than 15 days from draw of preadmission visit or order morning of surgery if day of surgery is greater than 6 days from preadmission visit.     Status: None   Collection Time: 12/11/20  3:38 PM  Result Value Ref Range   ABO/RH(D) O POS    Antibody Screen NEG    Sample Expiration      12/14/2020,2359 Performed at [facility], [address], [place], [place] [address]    No results found.  Review of Systems  Constitutional: Negative.   HENT: Negative.    Eyes: Negative.   Respiratory: Negative.    Cardiovascular: Negative.   Gastrointestinal: Negative.   Endocrine: Negative.   Genitourinary: Negative.   Skin: Negative.   Allergic/Immunologic: Negative.   Neurological: Negative.   Psychiatric/Behavioral: Negative.     Blood pressure 122/81, pulse 84, temperature 97.9 F (36.6 C), temperature source Oral, resp. rate 16, height 5\' 8"  (1.727 m), weight 65.8 kg, SpO2 100 %. Physical Exam Vitals reviewed.  Constitutional:      Appearance: Normal appearance. She is normal weight.  HENT:     Head: Normocephalic and atraumatic.     Nose: Nose normal.     Mouth/Throat:     Mouth: Mucous membranes are dry.  Musculoskeletal:     Comments: On exam of her right knee there is some minimal drainage coming out of the superior medial and lateral portal site, cloudy in appearance.  Very tender to palpation throughout the entirety of the knee.  Sizable effusion noted.  Limited range of motion of the right knee joint.  Calf is supple.  Neurovascularly intact in her right lower extremity.  Skin:    Capillary Refill: Capillary refill takes less than 2 seconds.  Neurological:     General: No focal deficit present.  Mental Status: She is alert and oriented to person, place, and time.  Psychiatric:        Mood and  Affect: Mood normal.        Behavior: Behavior normal.        Thought Content: Thought content normal.        Judgment: Judgment normal.     Assessment/Plan Right knee infection: It was discussed with the patient in the office yesterday.  There was cultures taken in the office they are still pending.  Patient needs a right knee I&D.  We have her set up for that today.  Risks and benefits of surgery been discussed with the patient.  Surgical versus nonsurgical management has been discussed.  Risks include bleeding, infection, damage to ligaments tendons blood vessels nerves and bones.  She has elected to proceed with surgery at this time.  She will most likely be staying in the hospital for a few days getting a PICC line placed.  Infectious disease will be consulted.  All questions encouraged and answered for the patient today.  Cherie Dark, [place] [facility] [phone] 12/11/2020, 7:39 PM

## 2020-12-11 NOTE — Progress Notes (Signed)
Pt up to BR to void. Very SOB and states her "lungs are very tight."  She uses albuterol inhaler but is out. Notified Dr Sampson Goon . Orders received.

## 2020-12-12 ENCOUNTER — Other Ambulatory Visit: Payer: Self-pay

## 2020-12-12 ENCOUNTER — Encounter (HOSPITAL_COMMUNITY): Payer: Self-pay | Admitting: Specialist

## 2020-12-12 DIAGNOSIS — M009 Pyogenic arthritis, unspecified: Secondary | ICD-10-CM | POA: Diagnosis not present

## 2020-12-12 LAB — GLUCOSE, CAPILLARY: Glucose-Capillary: 149 mg/dL — ABNORMAL HIGH (ref 70–99)

## 2020-12-12 LAB — CK: Total CK: 92 U/L (ref 38–234)

## 2020-12-12 LAB — HIV ANTIBODY (ROUTINE TESTING W REFLEX): HIV Screen 4th Generation wRfx: NONREACTIVE

## 2020-12-12 MED ORDER — ENOXAPARIN SODIUM 40 MG/0.4ML IJ SOSY
40.0000 mg | PREFILLED_SYRINGE | INTRAMUSCULAR | Status: DC
  Administered 2020-12-12 – 2020-12-17 (×6): 40 mg via SUBCUTANEOUS
  Filled 2020-12-12 (×6): qty 0.4

## 2020-12-12 MED ORDER — HYDROCODONE-ACETAMINOPHEN 7.5-325 MG PO TABS
1.0000 | ORAL_TABLET | ORAL | Status: DC | PRN
  Administered 2020-12-12 (×3): 1 via ORAL
  Administered 2020-12-13: 2 via ORAL
  Administered 2020-12-13 (×3): 1 via ORAL
  Administered 2020-12-14 (×3): 2 via ORAL
  Administered 2020-12-14: 1 via ORAL
  Administered 2020-12-15 – 2020-12-17 (×10): 2 via ORAL
  Filled 2020-12-12 (×2): qty 1
  Filled 2020-12-12 (×4): qty 2
  Filled 2020-12-12 (×2): qty 1
  Filled 2020-12-12: qty 2
  Filled 2020-12-12: qty 1
  Filled 2020-12-12 (×3): qty 2
  Filled 2020-12-12: qty 1
  Filled 2020-12-12: qty 2
  Filled 2020-12-12: qty 1
  Filled 2020-12-12: qty 2
  Filled 2020-12-12 (×3): qty 1
  Filled 2020-12-12 (×3): qty 2
  Filled 2020-12-12: qty 1

## 2020-12-12 MED ORDER — MORPHINE SULFATE (PF) 2 MG/ML IV SOLN
0.5000 mg | INTRAVENOUS | Status: DC | PRN
  Administered 2020-12-12 – 2020-12-14 (×3): 1 mg via INTRAVENOUS
  Filled 2020-12-12 (×3): qty 1

## 2020-12-12 MED ORDER — DIPHENHYDRAMINE HCL 12.5 MG/5ML PO ELIX
12.5000 mg | ORAL_SOLUTION | ORAL | Status: DC | PRN
Start: 2020-12-12 — End: 2020-12-17

## 2020-12-12 MED ORDER — ACETAMINOPHEN 325 MG PO TABS: 325.0000 mg | ORAL_TABLET | Freq: Four times a day (QID) | ORAL | Status: DC | PRN

## 2020-12-12 MED ORDER — ACETAMINOPHEN 500 MG PO TABS
500.0000 mg | ORAL_TABLET | Freq: Four times a day (QID) | ORAL | Status: AC
  Administered 2020-12-12 (×4): 500 mg via ORAL
  Filled 2020-12-12 (×4): qty 1

## 2020-12-12 MED ORDER — METOCLOPRAMIDE HCL 5 MG/ML IJ SOLN
10.0000 mg | INTRAMUSCULAR | Status: DC | PRN
  Administered 2020-12-12 – 2020-12-14 (×3): 10 mg via INTRAVENOUS
  Filled 2020-12-12 (×3): qty 2

## 2020-12-12 MED ORDER — METHOCARBAMOL 500 MG PO TABS: 500.0000 mg | ORAL_TABLET | Freq: Four times a day (QID) | ORAL | 0 refills | Status: DC

## 2020-12-12 MED ORDER — AMPHETAMINE-DEXTROAMPHET ER 10 MG PO CP24
30.0000 mg | ORAL_CAPSULE | ORAL | Status: DC
  Administered 2020-12-12 – 2020-12-17 (×6): 30 mg via ORAL
  Filled 2020-12-12 (×9): qty 3

## 2020-12-12 MED ORDER — HYDROCODONE-ACETAMINOPHEN 5-325 MG PO TABS
1.0000 | ORAL_TABLET | ORAL | Status: DC | PRN
  Administered 2020-12-12: 2 via ORAL
  Administered 2020-12-13: 1 via ORAL
  Filled 2020-12-12 (×2): qty 2

## 2020-12-12 MED ORDER — TRAMADOL HCL 50 MG PO TABS
50.0000 mg | ORAL_TABLET | Freq: Four times a day (QID) | ORAL | Status: DC
  Administered 2020-12-12 – 2020-12-17 (×22): 50 mg via ORAL
  Filled 2020-12-12 (×22): qty 1

## 2020-12-12 MED ORDER — SENNOSIDES-DOCUSATE SODIUM 8.6-50 MG PO TABS: 1.0000 | ORAL_TABLET | Freq: Every evening | ORAL | Status: DC | PRN

## 2020-12-12 MED ORDER — SODIUM CHLORIDE 0.9 % IV SOLN
2.0000 g | INTRAVENOUS | Status: DC
  Administered 2020-12-12 – 2020-12-17 (×6): 2 g via INTRAVENOUS
  Filled 2020-12-12: qty 2
  Filled 2020-12-12 (×2): qty 20
  Filled 2020-12-12: qty 2
  Filled 2020-12-12 (×2): qty 20

## 2020-12-12 MED ORDER — POLYVINYL ALCOHOL 1.4 % OP SOLN
1.0000 [drp] | OPHTHALMIC | Status: DC | PRN
  Filled 2020-12-12: qty 15

## 2020-12-12 MED ORDER — ONDANSETRON HCL 4 MG PO TABS: 4.0000 mg | ORAL_TABLET | Freq: Every day | ORAL | 1 refills | Status: DC | PRN

## 2020-12-12 MED ORDER — SODIUM CHLORIDE 0.9 % IV SOLN: INTRAVENOUS | Status: DC

## 2020-12-12 MED ORDER — TRAMADOL HCL 50 MG PO TABS: 50.0000 mg | ORAL_TABLET | Freq: Four times a day (QID) | ORAL | 0 refills | Status: DC | PRN

## 2020-12-12 MED ORDER — SODIUM CHLORIDE 0.9 % IV SOLN
8.0000 mg/kg | Freq: Every day | INTRAVENOUS | Status: DC
  Administered 2020-12-12 – 2020-12-13 (×2): 550 mg via INTRAVENOUS
  Filled 2020-12-12 (×3): qty 11

## 2020-12-12 MED ORDER — BISACODYL 5 MG PO TBEC
5.0000 mg | DELAYED_RELEASE_TABLET | Freq: Every day | ORAL | Status: DC | PRN
Start: 2020-12-12 — End: 2020-12-17
  Administered 2020-12-14: 5 mg via ORAL
  Filled 2020-12-12: qty 1

## 2020-12-12 MED ORDER — ONDANSETRON HCL 4 MG PO TABS: 4.0000 mg | ORAL_TABLET | Freq: Four times a day (QID) | ORAL | Status: DC | PRN

## 2020-12-12 MED ORDER — MAGNESIUM CITRATE PO SOLN: 1.0000 | Freq: Once | ORAL | Status: DC | PRN

## 2020-12-12 MED ORDER — OXYCODONE HCL 5 MG PO TABS: 5.0000 mg | ORAL_TABLET | ORAL | 0 refills | Status: AC | PRN

## 2020-12-12 MED ORDER — ONDANSETRON HCL 4 MG/2ML IJ SOLN
4.0000 mg | Freq: Four times a day (QID) | INTRAMUSCULAR | Status: DC | PRN
  Administered 2020-12-12 (×4): 4 mg via INTRAVENOUS
  Filled 2020-12-12 (×4): qty 2

## 2020-12-12 NOTE — Progress Notes (Signed)
Pt temp was taken rectally (96.1 F), no reading detected orally or axillary. Pt was given warm blankets. Pt was sweating and reports feeling cold and other times hot. Pt vomited once and is feeling nauseous. Gave Ondansetron 4 mg. Pt reports feeling weak. All other vitals stable, CBG was 149. Notified PA [employer].

## 2020-12-12 NOTE — Evaluation (Signed)
Physical Therapy Evaluation Patient Details Name: Natalie Yang MRN: [id number] DOB: 1970-09-24 Today's Date: 12/12/2020   History of Present Illness  Pt ~3wk s/p R knee arthroscopy and now with knee infection and s/p I&D 12/11/20.  Pt with hx of asthma  Clinical Impression  Pt admitted as above and presenting with functional mobility limitations 2* decreased R LE strength/ROM, post op pain and NWB status on R LE.  Pt should progress to dc home with family assist.    Follow Up Recommendations No PT follow up    Equipment Recommendations  Rolling walker with 5" wheels;3in1 (PT)    Recommendations for Other Services       Precautions / Restrictions Precautions Precautions: Fall Required Braces or Orthoses: Knee Immobilizer - Right Knee Immobilizer - Right: On when out of bed or walking Restrictions Weight Bearing Restrictions: Yes RLE Weight Bearing: Non weight bearing      Mobility  Bed Mobility Overal bed mobility: Modified Independent             General bed mobility comments: Pt self assisted R LE with UEs    Transfers Overall transfer level: Needs assistance Equipment used: Rolling walker (2 wheeled) Transfers: Sit to/from Stand Sit to Stand: Min assist         General transfer comment: cues for LE management and use of UEs to self assist  Ambulation/Gait Ambulation/Gait assistance: Min assist;Min guard Gait Distance (Feet): 60 Feet Assistive device: Rolling walker (2 wheeled) Gait Pattern/deviations: Step-to pattern;Decreased step length - right;Decreased step length - left;Shuffle;Trunk flexed Gait velocity: decr   General Gait Details: cues for posture and position from RW; good follow through with NWB on R LE  Stairs            Wheelchair Mobility    Modified Rankin (Stroke Patients Only)       Balance Overall balance assessment: Needs assistance Sitting-balance support: Feet supported;No upper extremity supported Sitting  balance-Leahy Scale: Good     Standing balance support: Bilateral upper extremity supported Standing balance-Leahy Scale: Poor                               Pertinent Vitals/Pain Pain Assessment: 0-10 Pain Score: 9  Pain Location: R knee after ambulation Pain Descriptors / Indicators: Aching;Grimacing;Sore Pain Intervention(s): Limited activity within patient's tolerance;Monitored during session;Premedicated before session    Home Living Family/patient expects to be discharged to:: Private residence Living Arrangements: Spouse/significant other Available Help at Discharge: Family Type of Home: House Home Access: Stairs to enter Entrance Stairs-Rails: None Secretary/administrator of Steps: 2 Home Layout: Two level Home Equipment: Crutches      Prior Function Level of Independence: Independent         Comments: Pt using crutches prior to admit 2* R knee pain but struggling with same     Hand Dominance        Extremity/Trunk Assessment   Upper Extremity Assessment Upper Extremity Assessment: Overall WFL for tasks assessed    Lower Extremity Assessment Lower Extremity Assessment: RLE deficits/detail RLE: Unable to fully assess due to pain;Unable to fully assess due to immobilization    Cervical / Trunk Assessment Cervical / Trunk Assessment: Normal  Communication   Communication: No difficulties  Cognition Arousal/Alertness: Awake/alert Behavior During Therapy: WFL for tasks assessed/performed;Anxious Overall Cognitive Status: Within Functional Limits for tasks assessed  General Comments      Exercises     Assessment/Plan    PT Assessment Patient needs continued PT services  PT Problem List Decreased strength;Decreased range of motion;Decreased activity tolerance;Decreased balance;Decreased mobility;Decreased knowledge of use of DME;Pain       PT Treatment Interventions DME  instruction;Gait training;Stair training;Functional mobility training;Therapeutic activities;Therapeutic exercise;Patient/family education    PT Goals (Current goals can be found in the Care Plan section)  Acute Rehab PT Goals Patient Stated Goal: Regain IND PT Goal Formulation: With patient Time For Goal Achievement: 12/19/20 Potential to Achieve Goals: Good    Frequency Min 5X/week   Barriers to discharge        Co-evaluation               AM-PAC PT "6 Clicks" Mobility  Outcome Measure Help needed turning from your back to your side while in a flat bed without using bedrails?: None Help needed moving from lying on your back to sitting on the side of a flat bed without using bedrails?: None Help needed moving to and from a bed to a chair (including a wheelchair)?: A Little Help needed standing up from a chair using your arms (e.g., wheelchair or bedside chair)?: A Little Help needed to walk in hospital room?: A Little Help needed climbing 3-5 steps with a railing? : A Lot 6 Click Score: 19    End of Session Equipment Utilized During Treatment: Gait belt;Right knee immobilizer Activity Tolerance: Patient tolerated treatment well;Patient limited by pain Patient left: in chair;with call bell/phone within reach;with chair alarm set Nurse Communication: Mobility status PT Visit Diagnosis: Unsteadiness on feet (R26.81);Difficulty in walking, not elsewhere classified (R26.2);Pain Pain - Right/Left: Right Pain - part of body: Knee    Time: 7829-5621 PT Time Calculation (min) (ACUTE ONLY): 17 min   Charges:   PT Evaluation $PT Eval Low Complexity: 1 Low          Mauro Kaufmann PT Acute Rehabilitation Services Pager [phone] Office [phone]   Bernette Seeman 12/12/2020, 4:11 PM

## 2020-12-12 NOTE — Progress Notes (Signed)
Subjective: 1 Day Post-Op Procedure(s) (LRB): ARTHROSCOPIC INCISION AND DRAINAGE KNEE ABSCESS (Right) Patient reports pain as moderate.  Some control with PO pain meds Patient does report N/V, chills and night sweats   Objective: Vital signs in last 24 hours: Temp:  [96.1 F (35.6 C)-98.4 F (36.9 C)] 97.6 F (36.4 C) (07/01 0400) Pulse Rate:  [58-92] 92 (07/01 0345) Resp:  [8-23] 16 (07/01 0345) BP: (111-136)/(69-98) 136/86 (07/01 0345) SpO2:  [85 %-100 %] 100 % (07/01 0345) Weight:  [65.8 kg] 65.8 kg (06/30 1409)  Intake/Output from previous day: 06/30 0701 - 07/01 0700 In: 1898.3 [P.O.:120; I.V.:1728.3; IV Piggyback:50] Out: 330 [Urine:300; Drains:30] Intake/Output this shift: No intake/output data recorded.  Recent Labs    12/11/20 1430 12/11/20 2244  HGB 13.6 12.1   Recent Labs    12/11/20 1430 12/11/20 2244  WBC 9.8 8.3  RBC 4.18 3.71*  HCT 42.2 37.8  PLT 318 252   Recent Labs    12/11/20 1430 12/11/20 2247  NA 137 137  K 3.3* 3.5  CL 102 103  CO2 26 26  BUN 11 7  CREATININE 0.55 0.55  GLUCOSE 108* 116*  CALCIUM 8.7* 8.4*   No results for input(s): LABPT, INR in the last 72 hours.  Neurologically intact Neurovascular intact Sensation intact distally Intact pulses distally Dorsiflexion/Plantar flexion intact   Assessment/Plan: 1 Day Post-Op Procedure(s) (LRB): ARTHROSCOPIC INCISION AND DRAINAGE KNEE ABSCESS (Right) Advance diet Continue ABX therapy due to Post-op infection Consult to ID for postop infection input, labs are still pending Knee immobilizer when oob, okay to remove when in bed Drain in, needs to remain in until no drainage DVT ppx: Lovenox in hospital, ASA 81 mg BID when out of hospital Follow up in the office in 2 weeks   Patient's anticipated LOS is less than 2 midnights, meeting these requirements: - Younger than 33 - Lives within 1 hour of care - Has a competent adult at home to recover with post-op recover - NO  history of  - Chronic pain requiring opiods  - Diabetes  - Coronary Artery Disease  - Heart failure  - Heart attack  - Stroke  - DVT/VTE  - Cardiac arrhythmia  - Respiratory Failure/COPD  - Renal failure  - Anemia  - Advanced Liver disease     Ceasar Mons [phone] 12/12/2020, 7:27 AM

## 2020-12-12 NOTE — Progress Notes (Signed)
Made Zadie Cleverly, PA aware of patient's continued nausea and vomiting after 8 mg of IV Zofran. Received verbal order for IV Reglan Q 4 hr PRN for nausea/vomiting. Will continue to monitor.

## 2020-12-12 NOTE — Plan of Care (Signed)
  Problem: Education: Goal: Knowledge of General Education information will improve Description Including pain rating scale, medication(s)/side effects and non-pharmacologic comfort measures Outcome: Progressing   

## 2020-12-12 NOTE — Consult Note (Signed)
[facility] for Infectious Disease    Date of Admission:  12/11/2020     Reason for Consult:Septic arthritis     Referring Physician: Dr Theda Sers  Current antibiotics: Daptomycin 7/1--present Ceftriaxone 7/1--present  ASSESSMENT:    Native joint septic arthritis: Presenting about 3 weeks after initial arthroscopy with signs and symptoms c/w septic arthritis.  S/p I&D 6/30 with orthopedics and currently on daptomycin and ceftriaxone.  Cultures pending.  PLAN:    Continue current antibiotics Follow up cultures Picc line Check baseline ESR/CRP Dr Johnnye Sima available this weekend   Active Problems:   Infection of right knee ([place])   MEDICATIONS:    Scheduled Meds:  acetaminophen  500 mg Oral Q6H   acyclovir  200 mg Oral Daily   amphetamine-dextroamphetamine  30 mg Oral 2 times per day   enoxaparin (LOVENOX) injection  40 mg Subcutaneous Q24H   gabapentin  300 mg Oral QHS   loratadine  10 mg Oral Daily   melatonin  10 mg Oral QHS   mometasone-formoterol  2 puff Inhalation BID   oxymetazoline  1 spray Each Nare BID   traMADol  50 mg Oral Q6H   venlafaxine XR  37.5 mg Oral Q breakfast   Continuous Infusions:  sodium chloride 50 mL/hr at 12/12/20 0125   cefTRIAXone (ROCEPHIN)  IV     DAPTOmycin (CUBICIN)  IV 550 mg (12/12/20 1124)   methocarbamol (ROBAXIN) IV Stopped (12/11/20 2214)   PRN Meds:.[START ON 12/13/2020] acetaminophen, albuterol, bisacodyl, busPIRone, butalbital-acetaminophen-caffeine, diphenhydrAMINE, HYDROcodone-acetaminophen, HYDROcodone-acetaminophen, hydrOXYzine, magnesium citrate, methocarbamol **OR** methocarbamol (ROBAXIN) IV, metoCLOPramide (REGLAN) injection, morphine injection, ondansetron **OR** ondansetron (ZOFRAN) IV, ondansetron, polyvinyl alcohol, senna-docusate, SUMAtriptan, traZODone  HPI:    Natalie Yang is a 50 y.o. female with no significant past medical history who is 3 weeks status post right knee arthroscopy who then began having  knee pain this past Monday.  She presented to the orthopedic office with significant pain, a sizable effusion, and drainage.  The knee was aspirated while she was in the office with cloudy fluid.  It was sent off for culture which as of this morning has not grown anything.  She tried icing her knee and anti-inflammatories without any relief.  She has also been experiencing nausea and reports of subjective fevers.  She did not take any antibiotics prior to admission.  She went to the OR yesterday with Dr. Theda Sers for I&D who noted intraoperatively thick yellowish purulent fluid.  This was sent for cultures which are currently pending.  She is on daptomycin and ceftriaxone.  She continues to endorse knee pain postoperatively with significant nausea.  She has been afebrile and her WBC is 8.3.   Past Medical History:  Diagnosis Date   Asthma    GERD (gastroesophageal reflux disease)    Headache    migraines   PONV (postoperative nausea and vomiting)    Vaginal delivery 1997    Social History   Tobacco Use   Smoking status: Former    Pack years: 0.00   Smokeless tobacco: Never  Substance Use Topics   Alcohol use: Yes    Comment: socially   Drug use: No    History reviewed. No pertinent family history.  No Known Allergies  Review of Systems  All other systems reviewed and are negative. Except as noted in HPI.  OBJECTIVE:   Blood pressure 122/80, pulse 66, temperature (!) 97.5 F (36.4 C), resp. rate 14, height _0  (1.727 m), weight 65.8 kg, SpO2  99 %. Body mass index is 22.05 kg/m.  Physical Exam Constitutional:      General: She is not in acute distress.    Appearance: Normal appearance.  HENT:     Head: Normocephalic and atraumatic.  Eyes:     Extraocular Movements: Extraocular movements intact.     Conjunctiva/sclera: Conjunctivae normal.  Pulmonary:     Effort: Pulmonary effort is normal. No respiratory distress.  Abdominal:     General: There is no distension.   Musculoskeletal:     Cervical back: Normal range of motion and neck supple.     Comments: Right knee is in brace.  Able to move her toes and sensation intact.   Skin:    General: Skin is warm and dry.     Findings: No rash.  Neurological:     General: No focal deficit present.     Mental Status: She is alert and oriented to person, place, and time.  Psychiatric:        Mood and Affect: Mood normal.        Behavior: Behavior normal.     Lab Results: Lab Results  Component Value Date   WBC 8.3 12/11/2020   HGB 12.1 12/11/2020   HCT 37.8 12/11/2020   MCV 101.9 (H) 12/11/2020   PLT 252 12/11/2020    Lab Results  Component Value Date   NA 137 12/11/2020   K 3.5 12/11/2020   CO2 26 12/11/2020   GLUCOSE 116 (H) 12/11/2020   BUN 7 12/11/2020   CREATININE 0.55 12/11/2020   CALCIUM 8.4 (L) 12/11/2020   GFRNONAA >60 12/11/2020   GFRAA >60 05/02/2016    Lab Results  Component Value Date   ALT 18 12/11/2020   AST 23 12/11/2020   ALKPHOS 68 12/11/2020   BILITOT 0.4 12/11/2020    No results found for: CRP  No results found for: ESRSEDRATE  I have reviewed the micro and lab results in Epic.  Imaging: No results found.   Imaging independently reviewed in Epic.  Rayn[facility] for Infectious Disease [facility] [phone] pager 12/12/2020, 12:23 PM

## 2020-12-13 ENCOUNTER — Inpatient Hospital Stay: Payer: Self-pay

## 2020-12-13 DIAGNOSIS — Z20822 Contact with and (suspected) exposure to covid-19: Secondary | ICD-10-CM | POA: Diagnosis present

## 2020-12-13 DIAGNOSIS — Y838 Other surgical procedures as the cause of abnormal reaction of the patient, or of later complication, without mention of misadventure at the time of the procedure: Secondary | ICD-10-CM | POA: Diagnosis present

## 2020-12-13 DIAGNOSIS — Z79899 Other long term (current) drug therapy: Secondary | ICD-10-CM | POA: Diagnosis not present

## 2020-12-13 DIAGNOSIS — T8140XA Infection following a procedure, unspecified, initial encounter: Secondary | ICD-10-CM | POA: Diagnosis present

## 2020-12-13 DIAGNOSIS — K219 Gastro-esophageal reflux disease without esophagitis: Secondary | ICD-10-CM | POA: Diagnosis present

## 2020-12-13 DIAGNOSIS — Z87891 Personal history of nicotine dependence: Secondary | ICD-10-CM | POA: Diagnosis not present

## 2020-12-13 DIAGNOSIS — M009 Pyogenic arthritis, unspecified: Secondary | ICD-10-CM | POA: Diagnosis present

## 2020-12-13 LAB — C-REACTIVE PROTEIN: CRP: 7 mg/dL — ABNORMAL HIGH (ref <1.0)

## 2020-12-13 LAB — SEDIMENTATION RATE: Sed Rate: 30 mm/hr — ABNORMAL HIGH (ref 0–22)

## 2020-12-13 MED ORDER — SODIUM CHLORIDE 0.9 % IV SOLN: INTRAVENOUS | Status: DC | PRN

## 2020-12-13 MED ORDER — DESVENLAFAXINE SUCCINATE ER 100 MG PO TB24
200.0000 mg | ORAL_TABLET | Freq: Every day | ORAL | Status: DC
  Administered 2020-12-14 – 2020-12-17 (×4): 200 mg via ORAL

## 2020-12-13 NOTE — Progress Notes (Signed)
During my morning med pass, when I was scanning the patient's meds, patient stated that she took her Pristiq medication that she takes in the morning. I discovered that she had her pill box from home in her room with all of her other morning meds (in the pill box pockets). From that pill box, patient took Acyclovir, Loratadine, Adderall, Pristiq, and a birth control pill. Acyclovir, Loratadine, and Adderall are the morning meds that were scheduled for this AM. I called Glynis Smiles, PA to let her know that she took her morning meds that she had supplied with her and that I held the ones that were scheduled except for Lovenox. I had Glynis Smiles, PA talk to patient on the phone to discuss her need for the Pristiq med. Glynis Smiles, PA stated to contact pharmacy (fill out the home med paperwork and take that and the med to pharmacy) so that pharmacy can bring that med to her when it's time. Patient does not have the pill bottle for that med with her. She stated she would call her husband and have him bring that bottle in and from there, I can take that med and paperwork to pharmacy.       I discussed with patient [facility]'s policy about if the patient's medications are supplied by pharmacy here at [facility], that she has to get her meds from the pharmacy here, unless it is a home med that is not supplied by the pharmacy here. Will continue to monitor and make sure that patient's family takes the patient's pillbox home.

## 2020-12-13 NOTE — Progress Notes (Addendum)
Subjective: 2 Days Post-Op Procedure(s) (LRB): ARTHROSCOPIC INCISION AND DRAINAGE KNEE ABSCESS (Right) Patient reports pain as moderate.  Improving, Denies sweats/chills. N/V improved, only 1 episode of vomiting last night. Tolerating PO. Did very well with PT. +void, +flatus. Denies calf pain, CP, SOB.   Objective: Vital signs in last 24 hours: Temp:  [98 F (36.7 C)-99.2 F (37.3 C)] 99.2 F (37.3 C) (07/02 0529) Pulse Rate:  [78-89] 78 (07/02 0529) Resp:  [14-18] 14 (07/02 0529) BP: (121-128)/(77-90) 128/90 (07/02 0529) SpO2:  [97 %-100 %] 98 % (07/02 0529)  Intake/Output from previous day: 07/01 0701 - 07/02 0700 In: 2145.6 [P.O.:840; I.V.:1044.6; IV Piggyback:261] Out: 935 [Urine:850; Drains:85] Intake/Output this shift: No intake/output data recorded.  Recent Labs    12/11/20 1430 12/11/20 2244  HGB 13.6 12.1   Recent Labs    12/11/20 1430 12/11/20 2244  WBC 9.8 8.3  RBC 4.18 3.71*  HCT 42.2 37.8  PLT 318 252   Recent Labs    12/11/20 1430 12/11/20 2247  NA 137 137  K 3.3* 3.5  CL 102 103  CO2 26 26  BUN 11 7  CREATININE 0.55 0.55  GLUCOSE 108* 116*  CALCIUM 8.7* 8.4*   No results for input(s): LABPT, INR in the last 72 hours.  Neurologically intact ABD soft Neurovascular intact Sensation intact distally Intact pulses distally Dorsiflexion/Plantar flexion intact Incision: no drainage No cellulitis present Compartment soft Drain with continued output this am.    Assessment/Plan: 2 Days Post-Op Procedure(s) (LRB): ARTHROSCOPIC INCISION AND DRAINAGE KNEE ABSCESS (Right) Advance diet Up with therapy Continue ABX therapy due to Post-op infection Knee imobilizer when oob Drain in, remain in until no drainage DVT ppx: Lovenox as inpatient, transition to ASA 81 BID on discharge.  ID on board for abx recs. Patient needs PICC line. Cultures pending.   Will D/C when PICC line place, OPAT and HH set up, drain pulled. Then F/U with Dr. Thomasena Edis in  2 weeks.   Addendum: After seeing patient in rounds and talking to nurse I received a call from RN stating that patient had brought home meds with her. Per patient she has NOT taken any of her home meds today. She expressed concern that her home Pristiq has been subsituted with effexor and that this is what was making her sick. She also expressed concern that she is not getting her hormone replacement. We will D/C the effexor and have pharmacy verify her home Pristiq. Explained that estrogen replacement, especially in the setting of recent surgery and knee immobilization increases risk for blood clot and so we will continue to hold this medication. She agreed to send the rest of her home meds home with husband.   Rhodia Albright 12/13/2020, 8:48 AM

## 2020-12-13 NOTE — Progress Notes (Signed)
Patient's home med Pristiq has been taken down to pharmacy for dispensing.

## 2020-12-13 NOTE — Plan of Care (Signed)
  Problem: Education: Goal: Knowledge of General Education information will improve Description: Including pain rating scale, medication(s)/side effects and non-pharmacologic comfort measures Outcome: Progressing   Problem: Activity: Goal: Risk for activity intolerance will decrease Outcome: Progressing   

## 2020-12-13 NOTE — Progress Notes (Signed)
Followed up with the patient to see when her husband would be here to pick up the patient's pill box and bring the Pristiq med bottle. She said her husband would be arriving sometime after 2 PM today.

## 2020-12-14 MED ORDER — DIAZEPAM 5 MG PO TABS
2.5000 mg | ORAL_TABLET | Freq: Four times a day (QID) | ORAL | Status: DC | PRN
  Administered 2020-12-15: 2.5 mg via ORAL
  Filled 2020-12-14: qty 1

## 2020-12-14 NOTE — Plan of Care (Signed)
  Problem: Nutrition: Goal: Adequate nutrition will be maintained Outcome: Progressing   Problem: Coping: Goal: Level of anxiety will decrease Outcome: Progressing   Problem: Elimination: Goal: Will not experience complications related to urinary retention Outcome: Progressing   Problem: Pain Managment: Goal: General experience of comfort will improve Outcome: Progressing   Problem: Safety: Goal: Ability to remain free from injury will improve Outcome: Progressing   

## 2020-12-14 NOTE — Plan of Care (Signed)
  Problem: Education: Goal: Knowledge of General Education information will improve Description: Including pain rating scale, medication(s)/side effects and non-pharmacologic comfort measures 12/14/2020 1305 by Towanda Malkin, RN Outcome: Progressing 12/14/2020 1132 by Towanda Malkin, RN Outcome: Progressing   Problem: Health Behavior/Discharge Planning: Goal: Ability to manage health-related needs will improve 12/14/2020 1305 by Towanda Malkin, RN Outcome: Progressing 12/14/2020 1132 by Towanda Malkin, RN Outcome: Progressing   Problem: Activity: Goal: Risk for activity intolerance will decrease Outcome: Progressing

## 2020-12-14 NOTE — Progress Notes (Signed)
PT Cancellation Note  Patient Details Name: Natalie Yang MRN: [id number] DOB: [birth year]   Cancelled Treatment:     PT deferred this date at request of pt 2* nausea/pain/headache.  Will follow in am for stair training as needed.   Hutson Luft 12/14/2020, 3:33 PM

## 2020-12-14 NOTE — Progress Notes (Signed)
PHARMACY CONSULT NOTE FOR:  OUTPATIENT  PARENTERAL ANTIBIOTIC THERAPY (OPAT)  Indication: PJI Regimen: Ceftriaxone 2 gr IV q24h  End date: 01/26/2021  IV antibiotic discharge orders are pended. To discharging provider:  please sign these orders via discharge navigator,  Select New Orders & click on the button choice - Manage This Unsigned Work.     Thank you for allowing pharmacy to be a part of this patient's care.   Adalberto Cole, PharmD, BCPS 12/14/2020 12:34 PM

## 2020-12-14 NOTE — Plan of Care (Signed)
°  Problem: Coping: °Goal: Level of anxiety will decrease °Outcome: Progressing °  °

## 2020-12-14 NOTE — Progress Notes (Signed)
Peripherally Inserted Central Catheter Placement  The IV Nurse has discussed with the patient and/or persons authorized to consent for the patient, the purpose of this procedure and the potential benefits and risks involved with this procedure.  The benefits include less needle sticks, lab draws from the catheter, and the patient may be discharged home with the catheter. Risks include, but not limited to, infection, bleeding, blood clot (thrombus formation), and puncture of an artery; nerve damage and irregular heartbeat and possibility to perform a PICC exchange if needed/ordered by physician.  Alternatives to this procedure were also discussed.             Elliot Dally 12/14/2020, 6:16 PM

## 2020-12-14 NOTE — Progress Notes (Signed)
Natalie Yang  MRN: [id number] DOB/Age: 10/06/1970 50 y.o. [facility] Procedure: Procedure(s) (LRB): ARTHROSCOPIC INCISION AND DRAINAGE KNEE ABSCESS (Right)     Subjective: Pain present still but mostly with any attempts on motion.  Vital Signs Temp:  [97.7 F (36.5 C)-98.1 F (36.7 C)] 97.8 F (36.6 C) (07/03 0659) Pulse Rate:  [77-86] 77 (07/03 0659) Resp:  [18-20] 20 (07/02 2114) BP: (125-134)/(81-85) 125/82 (07/03 0659) SpO2:  [97 %-100 %] 97 % (07/03 0749)  Lab Results Recent Labs    12/11/20 1430 12/11/20 2244  WBC 9.8 8.3  HGB 13.6 12.1  HCT 42.2 37.8  PLT 318 252   BMET Recent Labs    12/11/20 1430 12/11/20 2247  NA 137 137  K 3.3* 3.5  CL 102 103  CO2 26 26  GLUCOSE 108* 116*  BUN 11 7  CREATININE 0.55 0.55  CALCIUM 8.7* 8.4*    Her cultures still remain negative and being reincubated for better growth   Exam Dressings removed Still with purulent serosanguinous drainage and drain output with 30 cc over the last shift         Plan Dr. Ninetta Lights on the floor currently and addressing ongoing antibiotic recommendations. I will leave her drain until she is ready for DC since there is still drainage but thankfully appears to be slowing PICC line has been ordered but unsure when that will be placed. Once all arrangements made and PICC line in she can DC home  [facility] PA-C  12/14/2020, 10:55 AM Contact # [phone]

## 2020-12-14 NOTE — Progress Notes (Addendum)
Spoke with pt at length re PICC placement, risks and benefits.  Pt very anxious re procedure, trembling, became diaphoretic and restless.  Stated she had been nauseated for the past couple days and was concerned she would vomit.  Pt gave and signed consent for the procedure, then became more anxious. Due to level of anxiety, after lengthy conversation, agreed to hold on PICC placement until 12/15/20 and request antianxiety medication for pre procedure.  Pt almost immediately relaxed.  Securechat sent to Dr Thomasena Edis, Ralene Bathe PA and Judeth Cornfield RN re this encounter.  French Ana PA replied and is to order premed. Pt has current PIV working well at this time.

## 2020-12-14 NOTE — Progress Notes (Addendum)
INFECTIOUS DISEASE PROGRESS NOTE  ID: Natalie Yang is a 50 y.o. female with  Active Problems:   Infection of right knee ([place])  Subjective: Still some pain.   Abtx:  Anti-infectives (From admission, onward)    Start     Dose/Rate Route Frequency Ordered Stop   12/12/20 1200  cefTRIAXone (ROCEPHIN) 2 g in sodium chloride 0.9 % 100 mL IVPB        2 g 200 mL/hr over 30 Minutes Intravenous Every 24 hours 12/12/20 0755     12/12/20 1000  DAPTOmycin (CUBICIN) 550 mg in sodium chloride 0.9 % IVPB        8 mg/kg  65.8 kg 122 mL/hr over 30 Minutes Intravenous Daily 12/12/20 0755     12/12/20 0600  ceFAZolin (ANCEF) IVPB 2g/100 mL premix  Status:  Discontinued        2 g 200 mL/hr over 30 Minutes Intravenous On call to O.R. 12/11/20 1353 12/11/20 2046   12/12/20 0020  acyclovir (ZOVIRAX) 200 MG capsule 200 mg        200 mg Oral Daily 12/11/20 2121     12/11/20 2200  ceFAZolin (ANCEF) IVPB 2g/100 mL premix  Status:  Discontinued        2 g 200 mL/hr over 30 Minutes Intravenous Every 8 hours 12/11/20 2031 12/12/20 0755       Medications: Scheduled:  acyclovir  200 mg Oral Daily   amphetamine-dextroamphetamine  30 mg Oral 2 times per day   desvenlafaxine  200 mg Oral Daily   enoxaparin (LOVENOX) injection  40 mg Subcutaneous Q24H   gabapentin  300 mg Oral QHS   loratadine  10 mg Oral Daily   melatonin  10 mg Oral QHS   mometasone-formoterol  2 puff Inhalation BID   oxymetazoline  1 spray Each Nare BID   traMADol  50 mg Oral Q6H    Objective: Vital signs in last 24 hours: Temp:  [97.7 F (36.5 C)-98.1 F (36.7 C)] 97.8 F (36.6 C) (07/03 0659) Pulse Rate:  [77-86] 77 (07/03 0659) Resp:  [18-20] 20 (07/02 2114) BP: (125-134)/(81-85) 125/82 (07/03 0659) SpO2:  [97 %-100 %] 97 % (07/03 0749)   General appearance: alert, cooperative, and no distress Resp: clear to auscultation bilaterally Cardio: regular rate and rhythm Extremities: RLE wrapped.   Lab  Results Recent Labs    12/11/20 1430 12/11/20 2244 12/11/20 2247  WBC 9.8 8.3  --   HGB 13.6 12.1  --   HCT 42.2 37.8  --   NA 137  --  137  K 3.3*  --  3.5  CL 102  --  103  CO2 26  --  26  BUN 11  --  7  CREATININE 0.55  --  0.55   Liver Panel Recent Labs    12/11/20 1430  PROT 7.0  ALBUMIN 3.9  AST 23  ALT 18  ALKPHOS 68  BILITOT 0.4   Sedimentation Rate Recent Labs    12/13/20 0331  ESRSEDRATE 30*   C-Reactive Protein Recent Labs    12/13/20 0331  CRP 7.0*    Microbiology: Recent Results (from the past 240 hour(s))  SARS Coronavirus 2 by RT PCR (hospital order, performed in [facility] hospital lab) Nasopharyngeal Nasopharyngeal Swab     Status: None   Collection Time: 12/11/20  1:49 PM   Specimen: Nasopharyngeal Swab  Result Value Ref Range Status   SARS Coronavirus 2 NEGATIVE NEGATIVE Final    Comment: (  NOTE) SARS-CoV-2 target nucleic acids are NOT DETECTED.  The SARS-CoV-2 RNA is generally detectable in upper and lower respiratory specimens during the acute phase of infection. The lowest concentration of SARS-CoV-2 viral copies this assay can detect is 250 copies / mL. A negative result does not preclude SARS-CoV-2 infection and should not be used as the sole basis for treatment or other patient management decisions.  A negative result may occur with improper specimen collection / handling, submission of specimen other than nasopharyngeal swab, presence of viral mutation(s) within the areas targeted by this assay, and inadequate number of viral copies (<250 copies / mL). A negative result must be combined with clinical observations, patient history, and epidemiological information.  Fact Sheet for Patients:   StrictlyIdeas.no  Fact Sheet for Healthcare Providers: BankingDealers.co.za  This test is not yet approved or  cleared by the [place] FDA and has been authorized for detection and/or  diagnosis of SARS-CoV-2 by FDA under an Emergency Use Authorization (EUA).  This EUA will remain in effect (meaning this test can be used) for the duration of the COVID-19 declaration under Section 564(b)(1) of the Act, 21 U.S.C. section 360bbb-3(b)(1), unless the authorization is terminated or revoked sooner.  Performed at [facility], [place] [address], [place], [place] [address]   Aerobic/Anaerobic Culture w Gram Stain (surgical/deep wound)     Status: None (Preliminary result)   Collection Time: 12/11/20  8:52 PM   Specimen: Wound; Abscess  Result Value Ref Range Status   Specimen Description   Final    WOUND Performed at [facility] [address], [place], [place] [address]    Special Requests   Final    NONE Performed at [facility], [place] [address], [place], [place] [address]    Gram Stain   Final    ABUNDANT WBC PRESENT,BOTH PMN AND MONONUCLEAR NO ORGANISMS SEEN    Culture   Final    CULTURE REINCUBATED FOR BETTER GROWTH Performed at [facility], [place] [address], [place], [place] [address]    Report Status PENDING  Incomplete    Studies/Results: [place] EKG SITE RITE  Result Date: 12/13/2020 If Site Rite image not attached, placement could not be confirmed due to current cardiac rhythm.    Assessment/Plan: Septic joint post arthroscopy  Total days of antibiotics: 3 dapto/ceftrixone  Has (gram negative) colonies growing on chocalate and mckonkey agar.  Will stop dapto Hopefully will have further ID later today.  Explained to pt and gave my card and # to f/u with Appreciate ortho service getting PIC.  Will write opat orders with presumption that ceftriaxone should be adequate.  Will arrange for ID f/u 2-3 weeks with myself or Dr Juleen China.  Will f/u in AM         No Known Allergies  OPAT Orders Discharge antibiotics to be given via PICC line Discharge antibiotics: ceftriaxone 2g ivpb q24h Per  pharmacy protocol please  Duration: 6 weeks End Date: 01-26-21  [facility] Care Per Protocol: please  Home health RN for IV administration and teaching; PICC line care and labs.    Labs weekly while on IV antibiotics: _x_ CBC with differential __ BMP _x_ CMP __x CRP __x ESR __ Vancomycin trough __ CK  _x_ Please pull PIC at completion of IV antibiotics __ Please leave PIC in place until doctor has seen patient or been notified  Fax weekly labs to [phone]  Clinic Follow Up Appt: Johnnye Sima or  Juleen China, 2-3 weeks   Bobby Rumpf MD, FACP Infectious Diseases (pager) [phone] [email or web address] 12/14/2020, 10:53 AM  LOS: 1 day

## 2020-12-14 NOTE — Plan of Care (Signed)
  Problem: Education: Goal: Knowledge of General Education information will improve Description: Including pain rating scale, medication(s)/side effects and non-pharmacologic comfort measures Outcome: Progressing   Problem: Health Behavior/Discharge Planning: Goal: Ability to manage health-related needs will improve Outcome: Progressing   Problem: Activity: Goal: Risk for activity intolerance will decrease Outcome: Progressing   

## 2020-12-15 MED ORDER — SODIUM CHLORIDE 0.9% FLUSH: 10.0000 mL | INTRAVENOUS | Status: DC | PRN

## 2020-12-15 MED ORDER — SODIUM CHLORIDE 0.9% FLUSH
10.0000 mL | Freq: Two times a day (BID) | INTRAVENOUS | Status: DC
  Administered 2020-12-15 – 2020-12-17 (×3): 10 mL

## 2020-12-15 MED ORDER — CHLORHEXIDINE GLUCONATE CLOTH 2 % EX PADS
6.0000 | MEDICATED_PAD | Freq: Every day | CUTANEOUS | Status: DC
  Administered 2020-12-15 – 2020-12-17 (×3): 6 via TOPICAL

## 2020-12-15 NOTE — Progress Notes (Signed)
INFECTIOUS DISEASE PROGRESS NOTE  ID: Natalie Yang is a 50 y.o. female with  Active Problems:   Infection of right knee (HCC)  Subjective: No complaints. Seen with husband in the room.   Abtx:  Anti-infectives (From admission, onward)    Start     Dose/Rate Route Frequency Ordered Stop   12/12/20 1200  cefTRIAXone (ROCEPHIN) 2 g in sodium chloride 0.9 % 100 mL IVPB        2 g 200 mL/hr over 30 Minutes Intravenous Every 24 hours 12/12/20 0755     12/12/20 1000  DAPTOmycin (CUBICIN) 550 mg in sodium chloride 0.9 % IVPB  Status:  Discontinued        8 mg/kg  65.8 kg 122 mL/hr over 30 Minutes Intravenous Daily 12/12/20 0755 12/14/20 1114   12/12/20 0600  ceFAZolin (ANCEF) IVPB 2g/100 mL premix  Status:  Discontinued        2 g 200 mL/hr over 30 Minutes Intravenous On call to O.R. 12/11/20 1353 12/11/20 2046   12/12/20 0020  acyclovir (ZOVIRAX) 200 MG capsule 200 mg        200 mg Oral Daily 12/11/20 2121     12/11/20 2200  ceFAZolin (ANCEF) IVPB 2g/100 mL premix  Status:  Discontinued        2 g 200 mL/hr over 30 Minutes Intravenous Every 8 hours 12/11/20 2031 12/12/20 0755       Medications: Scheduled:  acyclovir  200 mg Oral Daily   amphetamine-dextroamphetamine  30 mg Oral 2 times per day   Chlorhexidine Gluconate Cloth  6 each Topical Daily   desvenlafaxine  200 mg Oral Daily   enoxaparin (LOVENOX) injection  40 mg Subcutaneous Q24H   gabapentin  300 mg Oral QHS   loratadine  10 mg Oral Daily   melatonin  10 mg Oral QHS   mometasone-formoterol  2 puff Inhalation BID   sodium chloride flush  10-40 mL Intracatheter Q12H   traMADol  50 mg Oral Q6H    Objective: Vital signs in last 24 hours: Temp:  [98.2 F (36.8 C)-99.4 F (37.4 C)] 98.7 F (37.1 C) (07/04 1437) Pulse Rate:  [85-93] 93 (07/04 1437) Resp:  [16-18] 18 (07/04 1437) BP: (119-133)/(78-83) 127/81 (07/04 1437) SpO2:  [97 %-100 %] 100 % (07/04 1437)   General appearance: alert, cooperative, and  no distress Extremities: knee wrapped. PIC in place RUE.   Lab Results No results for input(s): WBC, HGB, HCT, NA, K, CL, CO2, BUN, CREATININE, GLU in the last 72 hours.  Invalid input(s): PLATELETS Liver Panel No results for input(s): PROT, ALBUMIN, AST, ALT, ALKPHOS, BILITOT, BILIDIR, IBILI in the last 72 hours. Sedimentation Rate Recent Labs    12/13/20 0331  ESRSEDRATE 30*   C-Reactive Protein Recent Labs    12/13/20 0331  CRP 7.0*    Microbiology: Recent Results (from the past 240 hour(s))  SARS Coronavirus 2 by RT PCR (hospital order, performed in [facility] hospital lab) Nasopharyngeal Nasopharyngeal Swab     Status: None   Collection Time: 12/11/20  1:49 PM   Specimen: Nasopharyngeal Swab  Result Value Ref Range Status   SARS Coronavirus 2 NEGATIVE NEGATIVE Final    Comment: (NOTE) SARS-CoV-2 target nucleic acids are NOT DETECTED.  The SARS-CoV-2 RNA is generally detectable in upper and lower respiratory specimens during the acute phase of infection. The lowest concentration of SARS-CoV-2 viral copies this assay can detect is 250 copies / mL. A negative result does not preclude  SARS-CoV-2 infection and should not be used as the sole basis for treatment or other patient management decisions.  A negative result may occur with improper specimen collection / handling, submission of specimen other than nasopharyngeal swab, presence of viral mutation(s) within the areas targeted by this assay, and inadequate number of viral copies (<250 copies / mL). A negative result must be combined with clinical observations, patient history, and epidemiological information.  Fact Sheet for Patients:   BoilerBrush.com.cy  Fact Sheet for Healthcare Providers: https://pope.com/  This test is not yet approved or  cleared by the [place] FDA and has been authorized for detection and/or diagnosis of SARS-CoV-2 by FDA under an  Emergency Use Authorization (EUA).  This EUA will remain in effect (meaning this test can be used) for the duration of the COVID-19 declaration under Section 564(b)(1) of the Act, 21 U.S.C. section 360bbb-3(b)(1), unless the authorization is terminated or revoked sooner.  Performed at [facility], [address], [place], [place] [address]   Aerobic/Anaerobic Culture w Gram Stain (surgical/deep wound)     Status: None (Preliminary result)   Collection Time: 12/11/20  8:52 PM   Specimen: Wound; Abscess  Result Value Ref Range Status   Specimen Description   Final    WOUND Performed at [facility], [address], [place], [place] [address]    Special Requests   Final    NONE Performed at [facility], [address], [place], [place] [address]    Gram Stain   Final    ABUNDANT WBC PRESENT,BOTH PMN AND MONONUCLEAR NO ORGANISMS SEEN Performed at [facility], [address], [place], [place] [address]    Culture   Final    RARE PANTOEA SPECIES NO ANAEROBES ISOLATED; CULTURE IN PROGRESS FOR 5 DAYS    Report Status PENDING  Incomplete   Organism ID, Bacteria PANTOEA SPECIES  Final      Susceptibility   Pantoea species - MIC*    CEFAZOLIN <=4 SENSITIVE Sensitive     CEFEPIME <=0.12 SENSITIVE Sensitive     CEFTAZIDIME <=1 SENSITIVE Sensitive     CEFTRIAXONE <=0.25 SENSITIVE Sensitive     CIPROFLOXACIN <=0.25 SENSITIVE Sensitive     GENTAMICIN <=1 SENSITIVE Sensitive     IMIPENEM <=0.25 SENSITIVE Sensitive     TRIMETH/SULFA <=20 SENSITIVE Sensitive     PIP/TAZO <=4 SENSITIVE Sensitive     * RARE PANTOEA SPECIES    Studies/Results: [place] EKG SITE RITE  Result Date: 12/13/2020 If Site Rite image not attached, placement could not be confirmed due to current cardiac rhythm.    Assessment/Plan Pantoae septic joint post arthroscopy  Total days of antibiotics: 4 ceftriaxone  By sensitivities her infection should be  covered.  We discussed her infection and treatment course.  Her f/u is arranged Available as needed.          Johny Sax MD, FACP Infectious Diseases (pager) [phone] [email or web address] 12/15/2020, 4:39 PM  LOS: 2 days

## 2020-12-15 NOTE — Progress Notes (Signed)
Physical Therapy Treatment Patient Details Name: Natalie Yang MRN: [id number] DOB: [birth year] Today's Date: 12/15/2020    History of Present Illness Pt ~3wk s/p R knee arthroscopy and now with knee infection and s/p I&D 12/11/20.  Pt with hx of asthma    PT Comments    Pt requested to be seen after 1 pm after PICC placement and antibiotics.  Spouse in room.  Educated on use of KI "OOB".  Had spouse practice removing and applying KI under Therapist direction. KI can be removed for hygiene and ice as long as leg is fully supported and pt limites knee ROM.  Instructed pt and spouse to place a towel between brace and skin for hygiene so that towel can be washed/changed as pt will be wearing brace approx 6 weeks.  Assisted OOB to amb in hallway with walker.  Pt too quick/impulsive, instructed on safety.  Practice one step she has to enter her "sunken Living room". Practiced with walker and crutches in which pt was safer using B crutches for that one step instead of walker.  Returned to room and instructed on some TE's she can do with the KI on.  AP, knee presses, towel squeezes, SLR with leg strap and ABD/ADd "windshields" with strap.   Pt stated she will be on IV antibiotics for 6 weeks.   Educated her to remain NWB and KI use until [place] MD says otherwise.   Pt hopes to D/C to home today.   Follow Up Recommendations  No PT follow up     Equipment Recommendations  Rolling walker with 5" wheels;3in1 (PT)    Recommendations for Other Services       Precautions / Restrictions Precautions Precautions: Fall Required Braces or Orthoses: Knee Immobilizer - Right Knee Immobilizer - Right: On when out of bed or walking Restrictions Weight Bearing Restrictions: Yes RLE Weight Bearing: Non weight bearing Other Position/Activity Restrictions: also "limit knee ROM" per MD    Mobility  Bed Mobility Overal bed mobility: Modified Independent             General bed mobility comments: Pt self  assisted R LE with UEs    Transfers Overall transfer level: Needs assistance Equipment used: Rolling walker (2 wheeled) Transfers: Sit to/from [employer] Sit to Stand: Supervision Stand pivot transfers: Supervision       General transfer comment: cues for LE management and use of UEs to self assist VC's for safety (a little impulsive)  Ambulation/Gait Ambulation/Gait assistance: Supervision;Min guard Gait Distance (Feet): 36 Feet Assistive device: Rolling walker (2 wheeled);Crutches Gait Pattern/deviations: Step-to pattern;Decreased step length - right;Decreased step length - left;Shuffle;Trunk flexed Gait velocity: decr   General Gait Details: cues for posture and position from RW; good follow through with NWB on R LE.  Instruced to weak a shoe on opposite side.   Stairs Stairs: Yes Stairs assistance: Min guard;Min assist Stair Management: No rails;With walker;With crutches Number of Stairs: 1 General stair comments: pt stated she has one step to get into "sunkin livingroom" .  performed with B crutches and walker.  Pt was actually safer with the crutched for the one step then plans to use walker for everything else.   Wheelchair Mobility    Modified Rankin (Stroke Patients Only)       Balance  Cognition Arousal/Alertness: Awake/alert Behavior During Therapy: WFL for tasks assessed/performed;Anxious Overall Cognitive Status: Within Functional Limits for tasks assessed                                 General Comments: AxO x 3 a bit impulsive      Exercises      General Comments        Pertinent Vitals/Pain Pain Assessment: 0-10 Pain Score: 8  Pain Location: R knee after ambulation Pain Descriptors / Indicators: Aching;Grimacing;Sore Pain Intervention(s): Monitored during session;Premedicated before session;Repositioned;Ice applied    Home Living                       Prior Function            PT Goals (current goals can now be found in the care plan section) Progress towards PT goals: Progressing toward goals    Frequency    Min 5X/week      PT Plan Current plan remains appropriate    Co-evaluation              AM-PAC PT "6 Clicks" Mobility   Outcome Measure  Help needed turning from your back to your side while in a flat bed without using bedrails?: None Help needed moving from lying on your back to sitting on the side of a flat bed without using bedrails?: None Help needed moving to and from a bed to a chair (including a wheelchair)?: A Little Help needed standing up from a chair using your arms (e.g., wheelchair or bedside chair)?: A Little Help needed to walk in hospital room?: A Little Help needed climbing 3-5 steps with a railing? : A Little 6 Click Score: 20    End of Session Equipment Utilized During Treatment: Gait belt;Right knee immobilizer Activity Tolerance: Patient tolerated treatment well;Patient limited by pain;Other (comment) (some anxiety) Patient left: in bed;with family/visitor present;with call bell/phone within reach Nurse Communication: Mobility status PT Visit Diagnosis: Unsteadiness on feet (R26.81);Difficulty in walking, not elsewhere classified (R26.2);Pain Pain - Right/Left: Right Pain - part of body: Knee     Time: 1330-1400 PT Time Calculation (min) (ACUTE ONLY): 30 min  Charges:  $Gait Training: 8-22 mins $Therapeutic Activity: 8-22 mins                     {Bill Yohn  PTA Acute  Rehabilitation Services Pager      [phone] Office      [phone]

## 2020-12-15 NOTE — TOC Transition Note (Signed)
Transition of Care [facility]) - CM/SW Discharge Note   Patient Details  Name: Natalie Yang MRN: [id number] Date of Birth: August 26, 1970  [facility] CM/SW Contact:  Lennart Pall, LCSW Phone Number: 12/15/2020, 12:38 PM   Clinical Narrative:    Met with pt and spouse to review dc referrals.  They have just completed education with Carolynn Sayers ([facility]).  They are aware that [facility] will be providing [facility] visits.  DME was ordered/ delivered last Friday via [facility].  They are hopeful MD will clear for dc home later today.  No further TOC needs.   Final next level of care: [place] Barriers to Discharge: Continued Medical Work up   Patient Goals and CMS Choice Patient states their goals for this hospitalization and ongoing recovery are:: return home      Discharge Placement                       Discharge Plan and Services                DME Arranged: 3-N-1, Walker rolling DME Agency: [employer] Date DME Agency Contacted: 12/12/20 Time DME Agency Contacted: 1500 Representative spoke with at DME Agency: Ovid Curd HH Arranged: RN, IV Antibiotics [place] Agency: Other - See comment, [facility] ([place]) Date [place]: 12/12/20   Representative spoke with at [place]: [place] (SDOH) Interventions     Readmission Risk Interventions No flowsheet data found.

## 2020-12-15 NOTE — Progress Notes (Signed)
Subjective:  Patient reports pain as mild to moderate.  Denies N/V/CP/SOB. States knee is feeling better. PICC line placed this am.  Objective:   VITALS:   Vitals:   12/14/20 1925 12/14/20 2134 12/15/20 0510 12/15/20 0808  BP:  133/83 119/78   Pulse:  85 90   Resp:  17 16   Temp:  99.4 F (37.4 C) 98.2 F (36.8 C)   TempSrc:  Oral Oral   SpO2: 99% 99% 97% 98%  Weight:      Height:        HV: 130 cc overnight   NAD ABD soft Sensation intact distally Intact pulses distally Dorsiflexion/Plantar flexion intact Incision: dressing C/D/I Compartment soft HV serous fluid  Lab Results  Component Value Date   WBC 8.3 12/11/2020   HGB 12.1 12/11/2020   HCT 37.8 12/11/2020   MCV 101.9 (H) 12/11/2020   PLT 252 12/11/2020   BMET    Component Value Date/Time   NA 137 12/11/2020 2247   K 3.5 12/11/2020 2247   CL 103 12/11/2020 2247   CO2 26 12/11/2020 2247   GLUCOSE 116 (H) 12/11/2020 2247   BUN 7 12/11/2020 2247   CREATININE 0.55 12/11/2020 2247   CALCIUM 8.4 (L) 12/11/2020 2247   GFRNONAA >60 12/11/2020 2247   GFRAA >60 05/02/2016 1318   Recent Results (from the past 240 hour(s))  SARS Coronavirus 2 by RT PCR (hospital order, performed in [facility] hospital lab) Nasopharyngeal Nasopharyngeal Swab     Status: None   Collection Time: 12/11/20  1:49 PM   Specimen: Nasopharyngeal Swab  Result Value Ref Range Status   SARS Coronavirus 2 NEGATIVE NEGATIVE Final    Comment: (NOTE) SARS-CoV-2 target nucleic acids are NOT DETECTED.  The SARS-CoV-2 RNA is generally detectable in upper and lower respiratory specimens during the acute phase of infection. The lowest concentration of SARS-CoV-2 viral copies this assay can detect is 250 copies / mL. A negative result does not preclude SARS-CoV-2 infection and should not be used as the sole basis for treatment or other patient management decisions.  A negative result may occur with improper specimen collection /  handling, submission of specimen other than nasopharyngeal swab, presence of viral mutation(s) within the areas targeted by this assay, and inadequate number of viral copies (<250 copies / mL). A negative result must be combined with clinical observations, patient history, and epidemiological information.  Fact Sheet for Patients:   BoilerBrush.com.cy  Fact Sheet for Healthcare Providers: https://pope.com/  This test is not yet approved or  cleared by the [place] FDA and has been authorized for detection and/or diagnosis of SARS-CoV-2 by FDA under an Emergency Use Authorization (EUA).  This EUA will remain in effect (meaning this test can be used) for the duration of the COVID-19 declaration under Section 564(b)(1) of the Act, 21 U.S.C. section 360bbb-3(b)(1), unless the authorization is terminated or revoked sooner.  Performed at [facility], [address] [place], [place] [address]   Aerobic/Anaerobic Culture w Gram Stain (surgical/deep wound)     Status: None (Preliminary result)   Collection Time: 12/11/20  8:52 PM   Specimen: Wound; Abscess  Result Value Ref Range Status   Specimen Description   Final    WOUND Performed at [facility], [address] [place], [place] [address]    Special Requests   Final    NONE Performed at [facility], [address], [place], [place] [address]  Gram Stain   Final    ABUNDANT WBC PRESENT,BOTH PMN AND MONONUCLEAR NO ORGANISMS SEEN Performed at [facility], [address] [place], [place] [address]    Culture   Final    RARE GRAM VARIABLE ROD IDENTIFICATION AND SUSCEPTIBILITIES TO FOLLOW NO ANAEROBES ISOLATED; CULTURE IN PROGRESS FOR 5 DAYS    Report Status PENDING  Incomplete     Assessment/Plan: 4 Days Post-Op   Active Problems:   Infection of right knee (HCC)   WBAT with walker DVT ppx: Lovenox,  SCDs, TEDS PO pain control PT/OT R knee NSA: IV abx per ID, PICC placed, culture pending, drain output remains high Dispo: follow cultures, d/c drain when output decreases    Iline Oven Leea Rambeau 12/15/2020, 10:17 AM   Samson Frederic, MD [phone] [facility] is now [facility]  [place] [address], [address], [place], [place] [address] Phone: [phone] [email or web address] Facebook  [employer]

## 2020-12-15 NOTE — Progress Notes (Signed)
Patient states pharmacy for July 4th can be [facility] on [address], [place].   Alinda Deem

## 2020-12-15 NOTE — Plan of Care (Signed)
  Problem: Coping: Goal: Level of anxiety will decrease Outcome: Progressing   Problem: Pain Managment: Goal: General experience of comfort will improve Outcome: Progressing   

## 2020-12-15 NOTE — Progress Notes (Signed)
Peripherally Inserted Central Catheter Placement  The IV Nurse has discussed with the patient and/or persons authorized to consent for the patient, the purpose of this procedure and the potential benefits and risks involved with this procedure.  The benefits include less needle sticks, lab draws from the catheter, and the patient may be discharged home with the catheter. Risks include, but not limited to, infection, bleeding, blood clot (thrombus formation), and puncture of an artery; nerve damage and irregular heartbeat and possibility to perform a PICC exchange if needed/ordered by physician.  Alternatives to this procedure were also discussed.  Bard Power PICC patient education guide, fact sheet on infection prevention and patient information card has been provided to patient /or left at bedside.    PICC Placement Documentation  PICC Single Lumen 12/15/20 Right Basilic 40 cm 0 cm (Active)  Indication for Insertion or Continuance of Line Home intravenous therapies (PICC only) 12/15/20 0958  Exposed Catheter (cm) 0 cm 12/15/20 0958  Site Assessment Clean;Dry;Intact 12/15/20 0958  Line Status Flushed;Saline locked;Blood return noted 12/15/20 0958  Dressing Type Transparent;Securing device 12/15/20 0354  Dressing Status Clean;Dry;Intact 12/15/20 0958  Antimicrobial disc in place? Yes 12/15/20 0958  Dressing Intervention New dressing 12/15/20 0958  Dressing Change Due 12/22/20 12/15/20 0958       Annett Fabian 12/15/2020, 9:59 AM

## 2020-12-16 NOTE — Progress Notes (Signed)
Physical Therapy Treatment Patient Details Name: Natalie Yang MRN: [id number] DOB: 1971-04-01 Today's Date: 12/16/2020    History of Present Illness Pt ~3wk s/p R knee arthroscopy and now with knee infection and s/p I&D 12/11/20.  Pt with hx of asthma    PT Comments    Assited pt OOB to amb in hallway with walker. Per ortho MD note 12/15/20 pt now WBAT on R with RW. Continued instruction on safety. Cues needed for sequencing with RW and WBAT status. Cues also needed for heel strike and to keep R foot flat on ground and not toe walk. Practiced 1 step x3 repetitions with RW. Cues needed for sequqncing and keeping trunk extended. Had pt demonstrate TE that was instructed yesterday. Pt hopes to D/C home tomorrow.    Follow Up Recommendations  No PT follow up     Equipment Recommendations  Rolling walker with 5" wheels;3in1 (PT)    Recommendations for Other Services       Precautions / Restrictions Precautions Precautions: Fall Required Braces or Orthoses: Knee Immobilizer - Right Knee Immobilizer - Right: On when out of bed or walking Restrictions Weight Bearing Restrictions: Yes RLE Weight Bearing: Weight bearing as tolerated Other Position/Activity Restrictions: also "limit knee ROM" per MD    Mobility  Bed Mobility Overal bed mobility: Modified Independent             General bed mobility comments: Pt self assisted R LE with UEs    Transfers Overall transfer level: Needs assistance Equipment used: Rolling walker (2 wheeled) Transfers: Sit to/from [employer] Transfers Sit to Stand: Supervision Stand pivot transfers: Supervision       General transfer comment: cues for LE management and use of UEs to self assist VC's for safety (a little impulsive)  Ambulation/Gait Ambulation/Gait assistance: Supervision;Min guard Gait Distance (Feet): 40 Feet Assistive device: Rolling walker (2 wheeled) Gait Pattern/deviations: Step-to pattern;Decreased step length -  right;Decreased step length - left Gait velocity: decr   General Gait Details: cues for posture and position from RW, cues for sequencing.   Stairs Stairs: Yes Stairs assistance: Min guard Stair Management: No rails;With walker Number of Stairs: 1 General stair comments: 1 step repeated x3. cues needed for RW sequencing and posture. Some posterior lean noted when ascending stair. cues to move "nose over toes" to correct.   Wheelchair Mobility    Modified Rankin (Stroke Patients Only)       Balance                                            Cognition Arousal/Alertness: Awake/alert Behavior During Therapy: WFL for tasks assessed/performed;Anxious Overall Cognitive Status: Within Functional Limits for tasks assessed                                 General Comments: AxO x 3 a bit impulsive      Exercises Total Joint Exercises Ankle Circles/Pumps: AROM;Both;10 reps;Supine Hip ABduction/ADduction: AROM;Right;10 reps;Supine Straight Leg Raises: AROM;AAROM;Right;5 reps    General Comments        Pertinent Vitals/Pain Pain Assessment: Faces Faces Pain Scale: Hurts little more Pain Location: R knee after ambulation Pain Descriptors / Indicators: Aching;Sore;Guarding Pain Intervention(s): Limited activity within patient's tolerance;Monitored during session;Ice applied    Home Living  Prior Function            PT Goals (current goals can now be found in the care plan section) Acute Rehab PT Goals Patient Stated Goal: Regain IND PT Goal Formulation: With patient Time For Goal Achievement: 12/19/20 Potential to Achieve Goals: Good    Frequency    Min 5X/week      PT Plan      Co-evaluation              AM-PAC PT "6 Clicks" Mobility   Outcome Measure  Help needed turning from your back to your side while in a flat bed without using bedrails?: None Help needed moving from lying on your  back to sitting on the side of a flat bed without using bedrails?: None Help needed moving to and from a bed to a chair (including a wheelchair)?: A Little Help needed standing up from a chair using your arms (e.g., wheelchair or bedside chair)?: A Little Help needed to walk in hospital room?: A Little Help needed climbing 3-5 steps with a railing? : A Little 6 Click Score: 20    End of Session Equipment Utilized During Treatment: Gait belt;Right knee immobilizer Activity Tolerance: Patient tolerated treatment well Patient left: in bed;with call bell/phone within reach Nurse Communication: Mobility status PT Visit Diagnosis: Unsteadiness on feet (R26.81);Difficulty in walking, not elsewhere classified (R26.2);Pain Pain - Right/Left: Right Pain - part of body: Knee     Time: 1130-1204 PT Time Calculation (min) (ACUTE ONLY): 34 min  Charges:  $Gait Training: 8-22 mins $Therapeutic Activity: 8-22 mins                     Alma Friendly, PTA Student  Acute Rehabilitation Services Pager : [phone] Office : [phone]    Alma Friendly 12/16/2020, 1:00 PM

## 2020-12-16 NOTE — Progress Notes (Signed)
Subjective: 5 Days Post-Op Procedure(s) (LRB): ARTHROSCOPIC INCISION AND DRAINAGE KNEE ABSCESS (Right) Patient reports pain as moderate.   States some chills last night but no fever Still having some pain in the knee slowly improving  Objective: Vital signs in last 24 hours: Temp:  [97.6 F (36.4 C)-98.7 F (37.1 C)] 97.6 F (36.4 C) (07/05 0619) Pulse Rate:  [83-93] 84 (07/05 0619) Resp:  [18] 18 (07/05 0619) BP: (126-141)/(81-91) 141/91 (07/05 0619) SpO2:  [97 %-100 %] 97 % (07/05 0700)  Intake/Output from previous day: 07/04 0701 - 07/05 0700 In: 230 [P.O.:230] Out: 185 [Drains:185] Intake/Output this shift: Total I/O In: 600 [P.O.:600] Out: -   No results for input(s): HGB in the last 72 hours. No results for input(s): WBC, RBC, HCT, PLT in the last 72 hours. No results for input(s): NA, K, CL, CO2, BUN, CREATININE, GLUCOSE, CALCIUM in the last 72 hours. No results for input(s): LABPT, INR in the last 72 hours.  Neurologically intact Neurovascular intact Sensation intact distally Intact pulses distally Dorsiflexion/Plantar flexion intact Incision: dressing C/D/I No cellulitis present Compartment soft No noticeable effusion Still some cloudy drainage coming from drain site  Assessment/Plan: 5 Days Post-Op Procedure(s) (LRB): ARTHROSCOPIC INCISION AND DRAINAGE KNEE ABSCESS (Right) Advance diet Up with therapy Continue ABX therapy due to Post-op infection per infectious disease Plan for discharge tomorrow with possibly drain in and follow up in the office next week with Dr. Thomasena Edis I did discuss this with him today on the phone. He is on vacation all week   Anticipated LOS equal to or greater than 2 midnights due to - Age 51 and older with one or more of the following:  - Obesity  - Expected need for hospital services (PT, OT, Nursing) required for safe  discharge  - Anticipated need for postoperative skilled nursing care or inpatient rehab  - Active  co-morbidities: None OR   - Unanticipated findings during/Post Surgery: Infected knee joint or operative site  - Patient is a high risk of re-admission due to: None   Khyre Germond R Selim Durden 12/16/2020, 1:41 PM

## 2020-12-17 LAB — AEROBIC/ANAEROBIC CULTURE W GRAM STAIN (SURGICAL/DEEP WOUND)

## 2020-12-17 MED ORDER — CEFTRIAXONE IV (FOR PTA / DISCHARGE USE ONLY): 2.0000 g | INTRAVENOUS | 0 refills | Status: AC

## 2020-12-17 MED ORDER — HEPARIN SOD (PORK) LOCK FLUSH 100 UNIT/ML IV SOLN
250.0000 [IU] | INTRAVENOUS | Status: AC | PRN
  Administered 2020-12-17: 250 [IU]
  Filled 2020-12-17: qty 2.5

## 2020-12-17 NOTE — Discharge Summary (Signed)
Physician Discharge Summary  Patient ID: Natalie Yang MRN: [id number] DOB/AGE: [birth year] 50 y.o.  Admit date: 12/11/2020 Discharge date: 12/17/2020  Admission Diagnoses: Right knee postop infection  Discharge Diagnoses:  Active Problems:   Infection of right knee [facility])   Discharged Condition: good  Hospital Course: Patient was admitted on 6/30 due to a right knee postop infection after a right knee arthroscopy. She was admitted and had an I and D on the 30th. She was sent to PACU and postop floor in stable condition. She was started on IV abx postoperatively. Postop day 1 patient was seen by ID. Waiting for cultures to return. Patient was doing okay. Postop day 2 patient working with PT. No complaints, cultures returned and patient was able to stay on ancef IV. Postop day 3 PICC line placed. Doing well. Patient has been working with PT. Postop day 4 drain still producing a lot of fluid from knee joint. Postop day 5 patient's pain continuing to improve. She went home with PICC line and drain left in. She was sent on with IV abx, and pain meds.   Consults: ID  Significant Diagnostic Studies: microbiology: wound culture: positive for Pantoea species  Treatments: IV hydration, antibiotics: Ancef, analgesia: acetaminophen, Vicodin, and tramadol, anticoagulation: Lovenox, therapies: PT, and surgery: right knee I and D  Discharge Exam: Blood pressure 111/82, pulse 75, temperature 97.6 F (36.4 C), resp. rate 18, height '5\' 8"'  (1.727 m), weight 65.8 kg, SpO2 98 %. General appearance: alert, cooperative, appears stated age, and no distress Extremities: extremities normal, atraumatic, no cyanosis or edema Pulses:  L brachial 2+ R brachial 2+  L radial 2+ R radial 2+  L inguinal 2+ R inguinal 2+  L popliteal 2+ R popliteal 2+  L posterior tibial 2+ R posterior tibial 2+  L dorsalis pedis 2+ R dorsalis pedis 2+   Skin: Skin color, texture, turgor normal. No rashes or lesions Neurologic:  Grossly normal Incision/Wound: Dressing clean dry and intact, drain producing a whitish color   Disposition: Discharge disposition: 01-Home or Self Care       Discharge Instructions     [facility] pharmacist to adjust dose for Vancomycin, Aminoglycosides and other anti-infective therapies as requested by physician.   Complete by: As directed    [facility] to provide Cath Flo 36m   Complete by: As directed    Administer for PICC line occlusion and as ordered by physician for other access device issues.   Anaphylaxis Kit: Provided to treat any anaphylactic reaction to the medication being provided to the patient if First Dose or when requested by physician   Complete by: As directed    Epinephrine 172mml vial / amp: Administer 0.11m35m0.11ml61mubcutaneously once for moderate to severe anaphylaxis, nurse to call physician and pharmacy when reaction occurs and call 911 if needed for immediate care   Diphenhydramine 50mg28mIV vial: Administer 25-50mg 36mM PRN for first dose reaction, rash, itching, mild reaction, nurse to call physician and pharmacy when reaction occurs   Sodium Chloride 0.9% NS 500ml I15mdminister if needed for hypovolemic blood pressure drop or as ordered by physician after call to physician with anaphylactic reaction   Call MD / Call 911   Complete by: As directed    If you experience chest pain or shortness of breath, CALL 911 and be transported to the hospital emergency room.  If you develope a fever above 101 F, pus (white drainage) or increased drainage or redness at  the wound, or calf pain, call your surgeon's office.   Call MD / Call 911   Complete by: As directed    If you experience chest pain or shortness of breath, CALL 911 and be transported to the hospital emergency room.  If you develope a fever above 101 F, pus (white drainage) or increased drainage or redness at the wound, or calf pain, call your surgeon's office.   Change dressing on  IV access line weekly and PRN   Complete by: As directed    Constipation Prevention   Complete by: As directed    Drink plenty of fluids.  Prune juice may be helpful.  You may use a stool softener, such as Colace (over the counter) 100 mg twice a day.  Use MiraLax (over the counter) for constipation as needed.   Constipation Prevention   Complete by: As directed    Drink plenty of fluids.  Prune juice may be helpful.  You may use a stool softener, such as Colace (over the counter) 100 mg twice a day.  Use MiraLax (over the counter) for constipation as needed.   Diet - low sodium heart healthy   Complete by: As directed    Diet - low sodium heart healthy   Complete by: As directed    Discharge instructions   Complete by: As directed    Dr. Sydnee Cabal [facility] [address] [place], [place] [address] [phone]  Knee I and D: -Please take aspirin 81 mg twice a day 1 in the morning 1 in the evening -Okay to take Tylenol 1000 mg 2-3 times a day with pain medication -Please ice and elevate the leg -Do not start PT at this time -remain in knee immobilizer while out of bed, okay to take off when in bed -Please follow-up in the office in 1 week following discharge -Please take a stool softener while taking opioid pain medication -Please take a probiotic while taking antibiotics for 3 days -Okay to remove bandages in 3 days and replace with bandaids, keep incision site clean   Discharge instructions   Complete by: As directed    Start ASA 81 mg tomorrow once discharge Knee immobilizer on when OOB, okay to take it off when in bed WBAT with immobilizer on Follow up in the office on [address], needs to call the make appointment   Do not put a pillow under the knee. Place it under the heel.   Complete by: As directed    Driving restrictions   Complete by: As directed    No driving for two weeks   Driving restrictions   Complete by: As directed    No driving   Flush  IV access with Sodium Chloride 0.9% and Heparin 10 units/ml or 100 units/ml   Complete by: As directed    Home infusion instructions - [facility]   Complete by: As directed    Instructions: Flush IV access with Sodium Chloride 0.9% and Heparin 10units/ml or 100units/ml   Change dressing on IV access line: Weekly and PRN   Instructions Cath Flo 73m: Administer for PICC Line occlusion and as ordered by physician for other access device   [facility] pharmacist to adjust dose for: Vancomycin, Aminoglycosides and other anti-infective therapies as requested by physician   Increase activity slowly as tolerated   Complete by: As directed    Method of administration may be changed at the discretion of home infusion pharmacist based upon assessment  of the patient and/or caregiver's ability to self-administer the medication ordered   Complete by: As directed    Post-operative opioid taper instructions:   Complete by: As directed    POST-OPERATIVE OPIOID TAPER INSTRUCTIONS: It is important to wean off of your opioid medication as soon as possible. If you do not need pain medication after your surgery it is ok to stop day one. Opioids include: Codeine, Hydrocodone(Norco, Vicodin), Oxycodone(Percocet, oxycontin) and hydromorphone amongst others.  Long term and even short term use of opiods can cause: Increased pain response Dependence Constipation Depression Respiratory depression And more.  Withdrawal symptoms can include Flu like symptoms Nausea, vomiting And more Techniques to manage these symptoms Hydrate well Eat regular healthy meals Stay active Use relaxation techniques(deep breathing, meditating, yoga) Do Not substitute Alcohol to help with tapering If you have been on opioids for less than two weeks and do not have pain than it is ok to stop all together.  Plan to wean off of opioids This plan should start within one week post op of your joint  replacement. Maintain the same interval or time between taking each dose and first decrease the dose.  Cut the total daily intake of opioids by one tablet each day Next start to increase the time between doses. The last dose that should be eliminated is the evening dose.      Post-operative opioid taper instructions:   Complete by: As directed    POST-OPERATIVE OPIOID TAPER INSTRUCTIONS: It is important to wean off of your opioid medication as soon as possible. If you do not need pain medication after your surgery it is ok to stop day one. Opioids include: Codeine, Hydrocodone(Norco, Vicodin), Oxycodone(Percocet, oxycontin) and hydromorphone amongst others.  Long term and even short term use of opiods can cause: Increased pain response Dependence Constipation Depression Respiratory depression And more.  Withdrawal symptoms can include Flu like symptoms Nausea, vomiting And more Techniques to manage these symptoms Hydrate well Eat regular healthy meals Stay active Use relaxation techniques(deep breathing, meditating, yoga) Do Not substitute Alcohol to help with tapering If you have been on opioids for less than two weeks and do not have pain than it is ok to stop all together.  Plan to wean off of opioids This plan should start within one week post op of your joint replacement. Maintain the same interval or time between taking each dose and first decrease the dose.  Cut the total daily intake of opioids by one tablet each day Next start to increase the time between doses. The last dose that should be eliminated is the evening dose.      TED hose   Complete by: As directed    Use stockings (TED hose) for three weeks on both leg(s).  You may remove them at night for sleeping.   Weight bearing as tolerated   Complete by: As directed       Allergies as of 12/17/2020   No Known Allergies      Medication List     TAKE these medications    acyclovir 200 MG  capsule Commonly known as: ZOVIRAX Take 200 mg by mouth daily.   Afrin 12 Hour 0.05 % nasal spray Generic drug: oxymetazoline Place 1 spray into both nostrils 2 (two) times daily.   albuterol 108 (90 Base) MCG/ACT inhaler Commonly known as: VENTOLIN HFA Inhale 1-2 puffs into the lungs every 6 (six) hours as needed for wheezing or shortness of breath.   amphetamine-dextroamphetamine 30 MG  24 hr capsule Commonly known as: ADDERALL XR Take 30 mg by mouth 2 (two) times daily.   busPIRone 30 MG tablet Commonly known as: BUSPAR Take 30 mg by mouth 2 (two) times daily as needed for anxiety.   butalbital-acetaminophen-caffeine 50-325-40 MG tablet Commonly known as: FIORICET Take 1 tablet by mouth 2 (two) times daily as needed for headache or migraine.   cefTRIAXone  IVPB Commonly known as: ROCEPHIN Inject 2 g into the vein daily. Indication: PJI First Dose: No Last Day of Therapy:  01/26/2021 Labs - Once weekly:  CBC/D and BMP, Labs - Every other week:  ESR and CRP Method of administration: IV Push Method of administration may be changed at the discretion of home infusion pharmacist based upon assessment of the patient and/or caregiver's ability to self-administer the medication ordered.   cetirizine 10 MG tablet Commonly known as: ZYRTEC Take 10 mg by mouth daily.   desvenlafaxine 100 MG 24 hr tablet Commonly known as: PRISTIQ Take 200 mg by mouth daily.   Dry Eye Relief Drops 0.2-0.2-1 % Soln Generic drug: Glycerin-Hypromellose-PEG 400 Place 1 drop into both eyes daily as needed (dry eyes).   ECHINACEA COMB/GOLDEN SEAL PO Take 1 tablet by mouth daily.   estradiol-norethindrone 1-0.5 MG tablet Commonly known as: ACTIVELLA Take 1 tablet by mouth daily.   fluticasone-salmeterol 100-50 MCG/ACT Aepb Commonly known as: ADVAIR Inhale 1 puff into the lungs 2 (two) times daily.   gabapentin 300 MG capsule Commonly known as: NEURONTIN Take 300 mg by mouth at bedtime.    hydrOXYzine 50 MG tablet Commonly known as: ATARAX/VISTARIL Take 50-150 mg by mouth every 6 (six) hours as needed for anxiety or itching.   ibuprofen 800 MG tablet Commonly known as: ADVIL Take 800 mg by mouth at bedtime as needed (back pain).   Melatonin 10 MG Tabs Take 10 mg by mouth at bedtime.   methocarbamol 500 MG tablet Commonly known as: Robaxin Take 1 tablet (500 mg total) by mouth 4 (four) times daily.   ondansetron 4 MG disintegrating tablet Commonly known as: Zofran ODT Take 1 tablet (4 mg total) by mouth every 8 (eight) hours as needed for nausea or vomiting.   ondansetron 4 MG tablet Commonly known as: Zofran Take 1 tablet (4 mg total) by mouth daily as needed for nausea or vomiting.   oxyCODONE 5 MG immediate release tablet Commonly known as: Oxy IR/ROXICODONE Take 5 mg by mouth every 6 (six) hours as needed for pain. What changed: Another medication with the same name was added. Make sure you understand how and when to take each.   oxyCODONE 5 MG immediate release tablet Commonly known as: Roxicodone Take 1 tablet (5 mg total) by mouth every 4 (four) hours as needed for up to 7 days for severe pain. What changed: You were already taking a medication with the same name, and this prescription was added. Make sure you understand how and when to take each.   oxyCODONE-acetaminophen 10-325 MG tablet Commonly known as: PERCOCET Take 1 tablet by mouth 3 (three) times daily.   SUMAtriptan 100 MG tablet Commonly known as: IMITREX Take 100 mg by mouth every 2 (two) hours as needed for migraine. May repeat in 2 hours if headache persists or recurs.   traMADol 50 MG tablet Commonly known as: Ultram Take 1 tablet (50 mg total) by mouth every 6 (six) hours as needed.   traZODone 100 MG tablet Commonly known as: DESYREL Take 100 mg by mouth at  bedtime as needed for sleep.   triamcinolone cream 0.5 % Commonly known as: KENALOG Apply 1 application topically at  bedtime as needed (dry skin on hands).               Discharge Care Instructions  (From admission, onward)           Start     Ordered   12/17/20 0000  Change dressing on IV access line weekly and PRN  (Home infusion instructions - [facility] )        12/17/20 0718   12/12/20 0000  Weight bearing as tolerated        12/12/20 0741            Follow-up [facility] up.   Contact information: [phone]                Signed: Drue Novel 12/17/2020, 7:18 AM

## 2020-12-17 NOTE — Progress Notes (Signed)
Subjective: 6 Days Post-Op Procedure(s) (LRB): ARTHROSCOPIC INCISION AND DRAINAGE KNEE ABSCESS (Right) Patient reports pain as mild.  She does report that the pain is improving Denies any fevers or chills at this time  Objective: Vital signs in last 24 hours: Temp:  [97.6 F (36.4 C)-99 F (37.2 C)] 97.6 F (36.4 C) (07/06 0610) Pulse Rate:  [75-94] 75 (07/06 0610) Resp:  [16-18] 18 (07/06 0610) BP: (111-133)/(80-93) 111/82 (07/06 0610) SpO2:  [98 %-100 %] 98 % (07/06 0610)  Intake/Output from previous day: 07/05 0701 - 07/06 0700 In: 976.8 [P.O.:720; I.V.:42.8; IV Piggyback:214] Out: 195 [Drains:195] Intake/Output this shift: No intake/output data recorded.  No results for input(s): HGB in the last 72 hours. No results for input(s): WBC, RBC, HCT, PLT in the last 72 hours. No results for input(s): NA, K, CL, CO2, BUN, CREATININE, GLUCOSE, CALCIUM in the last 72 hours. No results for input(s): LABPT, INR in the last 72 hours.  Neurologically intact Neurovascular intact Sensation intact distally Intact pulses distally Dorsiflexion/Plantar flexion intact Compartment soft  Hemovac drain: ~ 20 at time of exam Assessment/Plan: 6 Days Post-Op Procedure(s) (LRB): ARTHROSCOPIC INCISION AND DRAINAGE KNEE ABSCESS (Right) Advance diet Up with therapy Continue ABX therapy due to Post-op infection Hopeful d/c home today Has PICC line placed, iv abx per ID Will be going home with drain in knee. Still producing too much drainage to be taken out. Needs drain care instructions from nursing staff Will need to call the office when she gets home for an appointment on monday   Anticipated LOS equal to or greater than 2 midnights due to - Age 51 and older with one or more of the following:  - Obesity  - Expected need for hospital services (PT, OT, Nursing) required for safe  discharge  - Anticipated need for postoperative skilled nursing care or inpatient rehab  - Active  co-morbidities: None OR   - Unanticipated findings during/Post Surgery: Infected knee joint or operative site  - Patient is a high risk of re-admission due to: None   Denman George [facility]  [phone] 12/17/2020, 7:09 AM

## 2020-12-25 ENCOUNTER — Telehealth: Payer: Self-pay | Admitting: Infectious Diseases

## 2020-12-25 NOTE — Telephone Encounter (Signed)
called pt- her recent notes/labs ay she is taking flagyl. I advised her to stop taking this.  She also needs a f/u appt.

## 2021-01-15 ENCOUNTER — Other Ambulatory Visit: Payer: Self-pay

## 2021-01-15 ENCOUNTER — Ambulatory Visit (INDEPENDENT_AMBULATORY_CARE_PROVIDER_SITE_OTHER): Payer: BC Managed Care – PPO | Admitting: Infectious Diseases

## 2021-01-15 ENCOUNTER — Encounter: Payer: Self-pay | Admitting: Infectious Diseases

## 2021-01-15 DIAGNOSIS — M009 Pyogenic arthritis, unspecified: Secondary | ICD-10-CM | POA: Diagnosis not present

## 2021-01-15 DIAGNOSIS — R29898 Other symptoms and signs involving the musculoskeletal system: Secondary | ICD-10-CM

## 2021-01-15 NOTE — Progress Notes (Signed)
   Subjective:    Patient ID: Natalie Yang, female    DOB: [birth year], 50 y.o.   MRN: [id number]  HPI 50 y.o. F with 3 weeks status post right knee arthroscopy who then began having R knee pain,  effusion, and drainage.  The knee was aspirated while she was in the office with cloudy fluid.  She went to the OR 6-30 with Dr. Thomasena Edis for I&D who noted intraoperatively thick yellowish purulent fluid.  She was initially started on daptomycin and ceftriaxone.  Her Cx grew P agglomerans.  She was d/c home 7-6 on Ceftriaxone for 6 weeks.   She still has Knee pain (3/10). She also has R arm pain, proximal to her PIC line (in her shoulder).  Had migraine and chill/fever one day last week.  No problems with ceftriaxone. End date 01-26-21.    Review of Systems  Constitutional:  Negative for appetite change, chills and fever.  Gastrointestinal:  Negative for constipation and diarrhea.  Genitourinary:  Negative for difficulty urinating.  Neurological:  Positive for headaches.      Objective:   Physical Exam Vitals reviewed.  Constitutional:      Appearance: Normal appearance.  HENT:     Mouth/Throat:     Mouth: Mucous membranes are moist.     Pharynx: No oropharyngeal exudate.  Eyes:     Extraocular Movements: Extraocular movements intact.     Pupils: Pupils are equal, round, and reactive to light.  Cardiovascular:     Rate and Rhythm: Normal rate and regular rhythm.  Pulmonary:     Effort: Pulmonary effort is normal.     Breath sounds: Normal breath sounds.  Abdominal:     General: Bowel sounds are normal. There is no distension.     Palpations: Abdomen is soft.     Tenderness: There is no abdominal tenderness.  Musculoskeletal:     Cervical back: Normal range of motion and neck supple.     Right lower leg: No edema.     Left lower leg: No edema.     Comments: Her R knee has some warmth, no swelling.  Her RUE PIC is C/D/I. There is no cordis in her UE.   Neurological:     Mental  Status: She is alert.     Motor: Weakness (she is unable to lift her R arm past 90 degrees.) present.         Assessment & Plan:

## 2021-01-15 NOTE — Assessment & Plan Note (Signed)
She has notable weakness in her R arm I explained this was unlikely to be related to her PIC.  Given she does not have pain or swelling, I don't think she has DVT.  Will ask Neuro to eval.

## 2021-01-15 NOTE — Assessment & Plan Note (Signed)
She appears to be doing well Will f/u her labs from home Will plan for her to complete her anbx on 8-15.  Will see her ~ 2 weeks afterwards. If she still has heat, or any swelling, will give her po anbx to f/u.  Discussed with PT and her husband, they are in agreement.

## 2021-01-20 ENCOUNTER — Encounter: Payer: Self-pay | Admitting: Neurology

## 2021-01-23 ENCOUNTER — Telehealth: Payer: Self-pay | Admitting: Pharmacist

## 2021-01-23 NOTE — Telephone Encounter (Signed)
Melissa at [facility] called asking about end date and pull PICC orders for patient. Per Dr. Moshe Cipro last note, patient's end date is 8/15. Ok to pull PICC at the completion of abx. Patient has a follow up with Dr. Ninetta Lights on 9/6.

## 2021-01-23 NOTE — Telephone Encounter (Signed)
Thank you :)

## 2021-02-17 ENCOUNTER — Ambulatory Visit: Payer: BC Managed Care – PPO | Admitting: Infectious Diseases

## 2021-02-26 ENCOUNTER — Ambulatory Visit (INDEPENDENT_AMBULATORY_CARE_PROVIDER_SITE_OTHER): Payer: BC Managed Care – PPO | Admitting: Infectious Diseases

## 2021-02-26 ENCOUNTER — Other Ambulatory Visit: Payer: Self-pay

## 2021-02-26 DIAGNOSIS — M009 Pyogenic arthritis, unspecified: Secondary | ICD-10-CM | POA: Diagnosis not present

## 2021-02-26 DIAGNOSIS — F419 Anxiety disorder, unspecified: Secondary | ICD-10-CM | POA: Diagnosis not present

## 2021-02-26 DIAGNOSIS — R21 Rash and other nonspecific skin eruption: Secondary | ICD-10-CM | POA: Diagnosis not present

## 2021-02-26 NOTE — Progress Notes (Signed)
   Subjective:    Patient ID: Natalie Yang, female    DOB: September 07, 1970, 50 y.o.   MRN: [id number]  HPI 50 y.o. F with 3 weeks status post right knee arthroscopy who then began having R knee pain,  effusion, and drainage.  The knee was aspirated while she was in the office with cloudy fluid.  She went to the OR 6-30 with Dr. Thomasena Edis for I&D who noted intraoperatively thick yellowish purulent fluid.  She was initially started on daptomycin and ceftriaxone.  Her Cx grew P agglomerans.  She was d/c home 7-6 on Ceftriaxone for 6 weeks.  Ceftriaxone completed 01-26-21. On no anbx now.  Still has some knee pain.   She returns today with c/o pruritis. She was seen by derm and her PCP. She was given rx for pepcid this am.  She was also given a rx for pymethrine. She is on day 4 today. She restarted the rx today due to pruritis.  She has multiple small lesions where her skin is excoriated.  She has been cleaning her father's papers.   She has washed sheets, washed her clothes (not all), towels.  Her husband is unaffected. Has pets (2 cats). No vermin on them.  Has been working outside.   Review of Systems  Constitutional:  Negative for chills and diaphoresis.  Gastrointestinal:  Negative for constipation and diarrhea.  Genitourinary:  Negative for difficulty urinating.  No new rx.     Objective:   Physical Exam Vitals reviewed.  Skin:    Findings: Rash present.     Comments: Multiple small lesions, excoriated areas.   Psychiatric:        Speech: Speech is rapid and pressured.     Comments: Feels overwhelmed with with managing her parents. Mother with stage 4 lung cancer, father with moderate dementia.           Assessment & Plan:

## 2021-02-26 NOTE — Assessment & Plan Note (Signed)
She is on multiple rx,  anxiety is a bit elevated today.

## 2021-02-26 NOTE — Assessment & Plan Note (Signed)
This appears to be resolved. She has no swelling of her knee.

## 2021-02-26 NOTE — Assessment & Plan Note (Addendum)
She has no new soaps/shampoos/detergents/perfume.  She has been using permethrin. I suggested she can repeat the dose at day 14 if needed.  I've asked her to f/u with her dermatologist. I do not see vermin on her skin or scalp exam.  I suspect this is an allergic reaction combined with anxiety.  Encouraged her to continue to take atarax.  She can f/u with her PCP as well.  Return to ID clinic prn.

## 2021-03-20 ENCOUNTER — Ambulatory Visit: Payer: BC Managed Care – PPO | Admitting: Neurology

## 2021-06-09 ENCOUNTER — Emergency Department (HOSPITAL_BASED_OUTPATIENT_CLINIC_OR_DEPARTMENT_OTHER)
Admission: EM | Admit: 2021-06-09 | Discharge: 2021-06-09 | Disposition: A | Discharge status: Home / Self Care | Payer: BC Managed Care – PPO | Attending: Emergency Medicine | Admitting: Emergency Medicine

## 2021-06-09 ENCOUNTER — Emergency Department (HOSPITAL_BASED_OUTPATIENT_CLINIC_OR_DEPARTMENT_OTHER): Payer: BC Managed Care – PPO

## 2021-06-09 ENCOUNTER — Encounter (HOSPITAL_BASED_OUTPATIENT_CLINIC_OR_DEPARTMENT_OTHER): Payer: Self-pay | Admitting: Emergency Medicine

## 2021-06-09 ENCOUNTER — Other Ambulatory Visit: Payer: Self-pay

## 2021-06-09 ENCOUNTER — Ambulatory Visit
Admission: EM | Admit: 2021-06-09 | Discharge: 2021-06-09 | Disposition: A | Discharge status: Home / Self Care | Payer: BC Managed Care – PPO

## 2021-06-09 DIAGNOSIS — Z23 Encounter for immunization: Secondary | ICD-10-CM | POA: Diagnosis not present

## 2021-06-09 DIAGNOSIS — S51832A Puncture wound without foreign body of left forearm, initial encounter: Secondary | ICD-10-CM | POA: Insufficient documentation

## 2021-06-09 DIAGNOSIS — Z79899 Other long term (current) drug therapy: Secondary | ICD-10-CM | POA: Insufficient documentation

## 2021-06-09 DIAGNOSIS — W540XXA Bitten by dog, initial encounter: Secondary | ICD-10-CM | POA: Insufficient documentation

## 2021-06-09 DIAGNOSIS — Z87891 Personal history of nicotine dependence: Secondary | ICD-10-CM | POA: Diagnosis not present

## 2021-06-09 DIAGNOSIS — S71132A Puncture wound without foreign body, left thigh, initial encounter: Secondary | ICD-10-CM | POA: Diagnosis not present

## 2021-06-09 DIAGNOSIS — J45909 Unspecified asthma, uncomplicated: Secondary | ICD-10-CM | POA: Insufficient documentation

## 2021-06-09 DIAGNOSIS — S51852A Open bite of left forearm, initial encounter: Secondary | ICD-10-CM | POA: Diagnosis present

## 2021-06-09 DIAGNOSIS — Z7951 Long term (current) use of inhaled steroids: Secondary | ICD-10-CM | POA: Diagnosis not present

## 2021-06-09 MED ORDER — IBUPROFEN 600 MG PO TABS: 600.0000 mg | ORAL_TABLET | Freq: Four times a day (QID) | ORAL | 0 refills | Status: AC | PRN

## 2021-06-09 MED ORDER — KETOROLAC TROMETHAMINE 60 MG/2ML IM SOLN
60.0000 mg | Freq: Once | INTRAMUSCULAR | Status: AC
  Administered 2021-06-09: 18:00:00 60 mg via INTRAMUSCULAR
  Filled 2021-06-09: qty 2

## 2021-06-09 MED ORDER — AMOXICILLIN-POT CLAVULANATE 875-125 MG PO TABS
1.0000 | ORAL_TABLET | Freq: Once | ORAL | Status: AC
  Administered 2021-06-09: 18:00:00 1 via ORAL
  Filled 2021-06-09: qty 1

## 2021-06-09 MED ORDER — TETANUS-DIPHTH-ACELL PERTUSSIS 5-2.5-18.5 LF-MCG/0.5 IM SUSY
0.5000 mL | PREFILLED_SYRINGE | Freq: Once | INTRAMUSCULAR | Status: AC
  Administered 2021-06-09: 18:00:00 0.5 mL via INTRAMUSCULAR
  Filled 2021-06-09: qty 0.5

## 2021-06-09 MED ORDER — AMOXICILLIN-POT CLAVULANATE 875-125 MG PO TABS: 1.0000 | ORAL_TABLET | Freq: Two times a day (BID) | ORAL | 0 refills | Status: DC

## 2021-06-09 MED ORDER — ACETAMINOPHEN 325 MG PO TABS: 650.0000 mg | ORAL_TABLET | Freq: Four times a day (QID) | ORAL | 0 refills | Status: AC | PRN

## 2021-06-09 NOTE — ED Provider Notes (Signed)
[facility] EMERGENCY DEPT Provider Note   CSN: [id number] Arrival date & time: 06/09/21  1548     History Chief Complaint  Patient presents with   Animal Bite    Natalie Yang is a 50 y.o. female.  This is a 50 y.o. female  with significant medical history as below, including asthma, GERD who presents to the ED with complaint of dog bite.  Patient ports she has been dog sitting a friend's 2 large dogs.  Earlier today the dogs were fighting over a deer leg, he attempted to break up the fight and was bit on her left upper and left lower extremity.  Reports the dogs are up-to-date on their immunizations, dogs were acting normally prior to incident.  Acting normally following incident.  Patient irrigated the wound after the incident reports the emergency department for evaluation.  No fevers, chills, numbness, tingling, she is right-handed.  She took Aleve earlier today to help with the arm pain which did not significant improve her symptoms at the time of the incident.  Incident was around  8-830am this morning.   Pain is sharp, stabbing, aching.  Worse with direct palpation or movement.  The history is provided by the patient. No language interpreter was used.  Animal Bite Associated symptoms: no fever and no rash       Past Medical History:  Diagnosis Date   Asthma    GERD (gastroesophageal reflux disease)    Headache    migraines   PONV (postoperative nausea and vomiting)    Vaginal delivery 1997    Patient Active Problem List   Diagnosis Date Noted   Rash 02/26/2021   Anxiety 02/26/2021   Right arm weakness 01/15/2021   Infection of right knee (HCC) 12/11/2020   Migraine 11/01/2013    Past Surgical History:  Procedure Laterality Date   BLADDER SUSPENSION N/A 08/30/2014   Procedure: TRANSVAGINAL TAPE (TVT) PROCEDURE;  Surgeon: Lavina Hamman, MD;  Location: [facility];  Service: Gynecology;  Laterality: N/A;   CHOLECYSTECTOMY     CYSTOSCOPY N/A  08/30/2014   Procedure: CYSTOSCOPY;  Surgeon: Lavina Hamman, MD;  Location: [facility];  Service: Gynecology;  Laterality: N/A;   HYSTEROSCOPY WITH NOVASURE N/A 08/30/2014   Procedure: HYSTEROSCOPY WITH NOVASURE;  Surgeon: Lavina Hamman, MD;  Location: [facility];  Service: Gynecology;  Laterality: N/A;  Dr only needs 1 1/2hrs OR time   INCISION AND DRAINAGE ABSCESS Right 12/11/2020   Procedure: ARTHROSCOPIC INCISION AND DRAINAGE KNEE ABSCESS;  Surgeon: Eugenia Mcalpine, MD;  Location: [facility];  Service: Orthopedics;  Laterality: Right;   IUD REMOVAL N/A 08/30/2014   Procedure: INTRAUTERINE DEVICE (IUD) REMOVAL;  Surgeon: Lavina Hamman, MD;  Location: [facility];  Service: Gynecology;  Laterality: N/A;   KNEE SURGERY     LAPAROSCOPIC TUBAL LIGATION Bilateral 08/30/2014   Procedure: LAPAROSCOPIC TUBAL LIGATION;  Surgeon: Lavina Hamman, MD;  Location: [facility];  Service: Gynecology;  Laterality: Bilateral;   NOSE SURGERY     x 5   uterine ablation       OB History   No obstetric history on file.     History reviewed. No pertinent family history.  Social History   Tobacco Use   Smoking status: Former   Smokeless tobacco: Never  Substance Use Topics   Alcohol use: Yes    Comment: socially   Drug use: No    Home Medications Prior to Admission medications   Medication Sig Start Date End Date Taking? Authorizing Provider  acetaminophen (TYLENOL) 325 MG tablet Take 2 tablets (650 mg total) by mouth every 6 (six) hours as needed. 06/09/21  Yes Tanda Rockers A, DO  amoxicillin-clavulanate (AUGMENTIN) 875-125 MG tablet Take 1 tablet by mouth every 12 (twelve) hours. 06/09/21  Yes Tanda Rockers A, DO  ibuprofen (ADVIL) 600 MG tablet Take 1 tablet (600 mg total) by mouth every 6 (six) hours as needed. 06/09/21  Yes Tanda Rockers A, DO  acyclovir (ZOVIRAX) 200 MG capsule Take 200 mg by mouth daily.    [provider]  albuterol (PROVENTIL HFA;VENTOLIN HFA) 108 (90 BASE) MCG/ACT inhaler Inhale 1-2  puffs into the lungs every 6 (six) hours as needed for wheezing or shortness of breath.    [provider]  amphetamine-dextroamphetamine (ADDERALL XR) 30 MG 24 hr capsule Take 30 mg by mouth 2 (two) times daily. 12/05/20   [provider]  busPIRone (BUSPAR) 30 MG tablet Take 30 mg by mouth 2 (two) times daily as needed for anxiety. 12/05/20   [provider]  butalbital-acetaminophen-caffeine (FIORICET, ESGIC) 50-325-40 MG per tablet Take 1 tablet by mouth 2 (two) times daily as needed for headache or migraine.     [provider]  cetirizine (ZYRTEC) 10 MG tablet Take 10 mg by mouth daily.    [provider]  desvenlafaxine (PRISTIQ) 100 MG 24 hr tablet Take 200 mg by mouth daily. 08/01/20   [provider]  Echinacea-Goldenseal (ECHINACEA COMB/GOLDEN SEAL PO) Take 1 tablet by mouth daily.    [provider]  estradiol-norethindrone (ACTIVELLA) 1-0.5 MG tablet Take 1 tablet by mouth daily.    [provider]  fluticasone-salmeterol (ADVAIR) 100-50 MCG/ACT AEPB Inhale 1 puff into the lungs 2 (two) times daily. 08/16/18   [provider]  gabapentin (NEURONTIN) 300 MG capsule Take 300 mg by mouth at bedtime. 10/06/20   [provider]  Glycerin-Hypromellose-PEG 400 (DRY EYE RELIEF DROPS) 0.2-0.2-1 % SOLN Place 1 drop into both eyes daily as needed (dry eyes).    [provider]  hydrOXYzine (ATARAX/VISTARIL) 50 MG tablet Take 50-150 mg by mouth every 6 (six) hours as needed for anxiety or itching. 11/25/20   [provider]  ibuprofen (ADVIL,MOTRIN) 800 MG tablet Take 800 mg by mouth at bedtime as needed (back pain).    [provider]  Melatonin 10 MG TABS Take 10 mg by mouth at bedtime.    [provider]  methocarbamol (ROBAXIN) 500 MG tablet Take 1 tablet (500 mg total) by mouth 4 (four) times daily. 12/12/20   Haus, Pearletha Furl, PA  ondansetron (ZOFRAN) 4 MG tablet Take 1 tablet (4  mg total) by mouth daily as needed for nausea or vomiting. 12/12/20 12/12/21  Zadie Cleverly R, PA  oxyCODONE (OXY IR/ROXICODONE) 5 MG immediate release tablet Take 5 mg by mouth every 6 (six) hours as needed for pain. Patient not taking: Reported on 02/26/2021 12/10/20   [provider]  oxyCODONE-acetaminophen (PERCOCET) 10-325 MG tablet Take 1 tablet by mouth 3 (three) times daily. 01/16/20   [provider]  oxymetazoline (AFRIN 12 HOUR) 0.05 % nasal spray Place 1 spray into both nostrils 2 (two) times daily.    [provider]  SUMAtriptan (IMITREX) 100 MG tablet Take 100 mg by mouth every 2 (two) hours as needed for migraine. May repeat in 2 hours if headache persists or recurs.    [provider]  traMADol (ULTRAM) 50 MG tablet Take 1 tablet (50 mg total) by mouth  every 6 (six) hours as needed. 12/12/20 12/12/21  Cherie Dark, PA  traZODone (DESYREL) 100 MG tablet Take 100 mg by mouth at bedtime as needed for sleep. 12/05/20   [provider]  triamcinolone cream (KENALOG) 0.5 % Apply 1 application topically at bedtime as needed (dry skin on hands).    [provider]    Allergies    Patient has no known allergies.  Review of Systems   Review of Systems  Constitutional:  Negative for activity change and fever.  HENT:  Negative for facial swelling and trouble swallowing.   Eyes:  Negative for discharge and redness.  Respiratory:  Negative for cough and shortness of breath.   Cardiovascular:  Negative for chest pain and palpitations.  Gastrointestinal:  Negative for abdominal pain and nausea.  Genitourinary:  Negative for dysuria and flank pain.  Musculoskeletal:  Negative for back pain and gait problem.  Skin:  Positive for wound. Negative for pallor and rash.  Neurological:  Negative for syncope and headaches.   Physical Exam Updated Vital Signs BP (!) 148/100    Pulse (!) 101    Temp (!) 97.5 F (36.4 C)    Resp 16    Ht 5\' 8"  (1.727 m)     Wt 67.1 kg    SpO2 100%    BMI 22.50 kg/m   Physical Exam Vitals and nursing note reviewed.  Constitutional:      General: She is not in acute distress.    Appearance: Normal appearance.  HENT:     Head: Normocephalic and atraumatic.     Right Ear: External ear normal.     Left Ear: External ear normal.     Nose: Nose normal.     Mouth/Throat:     Mouth: Mucous membranes are moist.  Eyes:     General: No scleral icterus.       Right eye: No discharge.        Left eye: No discharge.  Cardiovascular:     Rate and Rhythm: Normal rate and regular rhythm.     Pulses: Normal pulses.     Heart sounds: Normal heart sounds.  Pulmonary:     Effort: Pulmonary effort is normal. No respiratory distress.     Breath sounds: Normal breath sounds.  Abdominal:     General: Abdomen is flat.     Tenderness: There is no abdominal tenderness.  Musculoskeletal:        General: Normal range of motion.     Cervical back: Normal range of motion.     Right lower leg: No edema.     Left lower leg: No edema.  Skin:    General: Skin is warm and dry.     Capillary Refill: Capillary refill takes less than 2 seconds.          Comments: Neurovascular intact bilateral upper extremities equal and symmetric.  2+ radial pulse.  No active bleeding.  Neurological:     Mental Status: She is alert.  Psychiatric:        Mood and Affect: Mood normal.        Behavior: Behavior normal.    ED Results / Procedures / Treatments   Labs (all labs ordered are listed, but only abnormal results are displayed) Labs Reviewed - No data to display  EKG None  Radiology DG Forearm Left  Result Date: 06/09/2021 CLINICAL DATA:  Dog bite injury to the left forearm. EXAM: LEFT FOREARM - 2 VIEW COMPARISON:  Left hand 03/02/2019 FINDINGS: No evidence of acute fracture or dislocation involving the left radius or ulna. No focal bone lesion or bone destruction. Bone cortex appears intact. No abnormal periosteal reaction.  Soft tissues are unremarkable. No soft tissue gas or radiopaque foreign body identified. IMPRESSION: No acute bony abnormalities.  Soft tissues are unremarkable. Electronically Signed   By: Burman Nieves M.D.   On: 06/09/2021 17:09    Procedures Procedures   Medications Ordered in ED Medications  ketorolac (TORADOL) injection 60 mg (60 mg Intramuscular Given 06/09/21 1755)  Tdap (BOOSTRIX) injection 0.5 mL (0.5 mLs Intramuscular Given 06/09/21 1755)  amoxicillin-clavulanate (AUGMENTIN) 875-125 MG per tablet 1 tablet (1 tablet Oral Given 06/09/21 1756)    ED Course  I have reviewed the triage vital signs and the nursing notes.  Pertinent labs & imaging results that were available during my care of the patient were reviewed by me and considered in my medical decision making (see chart for details).    MDM Rules/Calculators/A&P                          CC: dog bite  This patient complains of above; this involves an extensive number of treatment options and is a complaint that carries with it a high risk of complications and morbidity. Vital signs were reviewed. Serious etiologies considered.  Record review:  Previous records obtained and reviewed    Work up as above, notable for:  Imaging results that were available during my care of the patient were reviewed by me and considered in my medical decision making.   I ordered imaging studies which included forearm xr L and I independently visualized and interpreted imaging which showed no acute process  Management: Patient given Augmentin, Toradol, tetanus status updated  Reassessment:  Wounds cleaned and dressed.  Given these were dog bites and the wounds are very small, puncture wounds will leave wounds open.  No active bleeding.  Educated patient on wound care, return precautions.  Start oral antibiotics.  Analgesics for home.  Close PCP follow-up.  LUE is neurovascularly intact, no ongoing bleeding. Warm, well perfused  extremities.   The patient improved significantly and was discharged in stable condition. Detailed discussions were had with the patient regarding current findings, and need for close f/u with PCP or on call doctor. The patient has been instructed to return immediately if the symptoms worsen in any way for re-evaluation. Patient verbalized understanding and is in agreement with current care plan. All questions answered prior to discharge.           This chart was dictated using voice recognition software.  Despite best efforts to proofread,  errors can occur which can change the documentation meaning.   Final Clinical Impression(s) / ED Diagnoses Final diagnoses:  Dog bite, initial encounter    Rx / DC Orders ED Discharge Orders          Ordered    ibuprofen (ADVIL) 600 MG tablet  Every 6 hours PRN        06/09/21 1812    amoxicillin-clavulanate (AUGMENTIN) 875-125 MG tablet  Every 12 hours        06/09/21 1812    acetaminophen (TYLENOL) 325 MG tablet  Every 6 hours PRN        06/09/21 1812             Sloan Leiter, DO 06/09/21 1816

## 2021-06-09 NOTE — Discharge Instructions (Addendum)
You may take your home dose of oxycodone to help with the pain from the dog bite  Please return to the emergency department for any worsening or worrisome symptoms.   Follow up with your PCP

## 2021-06-09 NOTE — ED Notes (Signed)
Dc instructions reviewed with patient. Patient voiced understanding. Dc with belongings.  °

## 2021-06-09 NOTE — ED Triage Notes (Signed)
Dog bite today at 830 am , left fore arm multiple puncture wounds , actively bleeding , presents with pressure dressing soaked .  RN applying Quick-Clot dressing in triage . Alert and oriented x 4

## 2021-06-09 NOTE — ED Notes (Signed)
Left forearm puncture wounds cleansed, bacitracin applied and mepilex dressing applied. Wound care reviewed with patient.

## 2021-06-18 NOTE — Progress Notes (Deleted)
[facility] Neurology [facility] Clinic Note - Initial Visit   Date: 06/18/21  Natalie Yang MRN: [id number] DOB: 07/31/1970   Dear Dr. Ninetta Lights:  Thank you for your kind referral of Natalie Yang for consultation of right arm weakness. Although her history is well known to you, please allow [place] to reiterate it for the purpose of our medical record. The patient was accompanied to the clinic by *** who also provides collateral information.     History of Present Illness: Natalie Yang is a 51 y.o. ***-handed female with *** presenting for evaluation of right arm weakness.   Patient was referred in August 2022, and no showed her NP visit with me in October.  ***ow.   Out-side paper records, electronic medical record, and images have been reviewed where available and summarized as: *** No results found for: HGBA1C No results found for: VITAMINB12 No results found for: TSH Lab Results  Component Value Date   ESRSEDRATE 30 (H) 12/13/2020    Past Medical History:  Diagnosis Date   Asthma    GERD (gastroesophageal reflux disease)    Headache    migraines   PONV (postoperative nausea and vomiting)    Vaginal delivery 1997    Past Surgical History:  Procedure Laterality Date   BLADDER SUSPENSION N/A 08/30/2014   Procedure: TRANSVAGINAL TAPE (TVT) PROCEDURE;  Surgeon: Lavina Hamman, MD;  Location: [facility];  Service: Gynecology;  Laterality: N/A;   CHOLECYSTECTOMY     CYSTOSCOPY N/A 08/30/2014   Procedure: CYSTOSCOPY;  Surgeon: Lavina Hamman, MD;  Location: [facility];  Service: Gynecology;  Laterality: N/A;   HYSTEROSCOPY WITH NOVASURE N/A 08/30/2014   Procedure: HYSTEROSCOPY WITH NOVASURE;  Surgeon: Lavina Hamman, MD;  Location: [facility];  Service: Gynecology;  Laterality: N/A;  Dr only needs 1 1/2hrs OR time   INCISION AND DRAINAGE ABSCESS Right 12/11/2020   Procedure: ARTHROSCOPIC INCISION AND DRAINAGE KNEE ABSCESS;  Surgeon: Eugenia Mcalpine, MD;  Location: [facility];   Service: Orthopedics;  Laterality: Right;   IUD REMOVAL N/A 08/30/2014   Procedure: INTRAUTERINE DEVICE (IUD) REMOVAL;  Surgeon: Lavina Hamman, MD;  Location: [facility];  Service: Gynecology;  Laterality: N/A;   KNEE SURGERY     LAPAROSCOPIC TUBAL LIGATION Bilateral 08/30/2014   Procedure: LAPAROSCOPIC TUBAL LIGATION;  Surgeon: Lavina Hamman, MD;  Location: [facility];  Service: Gynecology;  Laterality: Bilateral;   NOSE SURGERY     x 5   uterine ablation       Medications:  Outpatient Encounter Medications as of 06/19/2021  Medication Sig   acetaminophen (TYLENOL) 325 MG tablet Take 2 tablets (650 mg total) by mouth every 6 (six) hours as needed.   acyclovir (ZOVIRAX) 200 MG capsule Take 200 mg by mouth daily.   albuterol (PROVENTIL HFA;VENTOLIN HFA) 108 (90 BASE) MCG/ACT inhaler Inhale 1-2 puffs into the lungs every 6 (six) hours as needed for wheezing or shortness of breath.   amoxicillin-clavulanate (AUGMENTIN) 875-125 MG tablet Take 1 tablet by mouth every 12 (twelve) hours.   amphetamine-dextroamphetamine (ADDERALL XR) 30 MG 24 hr capsule Take 30 mg by mouth 2 (two) times daily.   busPIRone (BUSPAR) 30 MG tablet Take 30 mg by mouth 2 (two) times daily as needed for anxiety.   butalbital-acetaminophen-caffeine (FIORICET, ESGIC) 50-325-40 MG per tablet Take 1 tablet by mouth 2 (two) times daily as needed for headache or migraine.    cetirizine (ZYRTEC) 10 MG tablet Take 10 mg by mouth daily.   desvenlafaxine (PRISTIQ) 100  MG 24 hr tablet Take 200 mg by mouth daily.   Echinacea-Goldenseal (ECHINACEA COMB/GOLDEN SEAL PO) Take 1 tablet by mouth daily.   estradiol-norethindrone (ACTIVELLA) 1-0.5 MG tablet Take 1 tablet by mouth daily.   fluticasone-salmeterol (ADVAIR) 100-50 MCG/ACT AEPB Inhale 1 puff into the lungs 2 (two) times daily.   gabapentin (NEURONTIN) 300 MG capsule Take 300 mg by mouth at bedtime.   Glycerin-Hypromellose-PEG 400 (DRY EYE RELIEF DROPS) 0.2-0.2-1 % SOLN Place 1 drop into  both eyes daily as needed (dry eyes).   hydrOXYzine (ATARAX/VISTARIL) 50 MG tablet Take 50-150 mg by mouth every 6 (six) hours as needed for anxiety or itching.   ibuprofen (ADVIL) 600 MG tablet Take 1 tablet (600 mg total) by mouth every 6 (six) hours as needed.   ibuprofen (ADVIL,MOTRIN) 800 MG tablet Take 800 mg by mouth at bedtime as needed (back pain).   Melatonin 10 MG TABS Take 10 mg by mouth at bedtime.   methocarbamol (ROBAXIN) 500 MG tablet Take 1 tablet (500 mg total) by mouth 4 (four) times daily.   ondansetron (ZOFRAN) 4 MG tablet Take 1 tablet (4 mg total) by mouth daily as needed for nausea or vomiting.   oxyCODONE (OXY IR/ROXICODONE) 5 MG immediate release tablet Take 5 mg by mouth every 6 (six) hours as needed for pain. (Patient not taking: Reported on 02/26/2021)   oxyCODONE-acetaminophen (PERCOCET) 10-325 MG tablet Take 1 tablet by mouth 3 (three) times daily.   oxymetazoline (AFRIN 12 HOUR) 0.05 % nasal spray Place 1 spray into both nostrils 2 (two) times daily.   SUMAtriptan (IMITREX) 100 MG tablet Take 100 mg by mouth every 2 (two) hours as needed for migraine. May repeat in 2 hours if headache persists or recurs.   traMADol (ULTRAM) 50 MG tablet Take 1 tablet (50 mg total) by mouth every 6 (six) hours as needed.   traZODone (DESYREL) 100 MG tablet Take 100 mg by mouth at bedtime as needed for sleep.   triamcinolone cream (KENALOG) 0.5 % Apply 1 application topically at bedtime as needed (dry skin on hands).   No facility-administered encounter medications on file as of 06/19/2021.    Allergies: No Known Allergies  Family History: No family history on file.  Social History: Social History   Tobacco Use   Smoking status: Former   Smokeless tobacco: Never  Substance Use Topics   Alcohol use: Yes    Comment: socially   Drug use: No   Social History   Social History Narrative   Not on file    Vital Signs:  There were no vitals taken for this visit.   General  Medical Exam:  *** General:  Well appearing, comfortable.   Eyes/ENT: see cranial nerve examination.   Neck:   No carotid bruits. Respiratory:  Clear to auscultation, good air entry bilaterally.   Cardiac:  Regular rate and rhythm, no murmur.   Extremities:  No deformities, edema, or skin discoloration.  Skin:  No rashes or lesions.  Neurological Exam: MENTAL STATUS including orientation to time, place, person, recent and remote memory, attention span and concentration, language, and fund of knowledge is ***normal.  Speech is not dysarthric.  CRANIAL NERVES: II:  No visual field defects.  Unremarkable fundi.   III-IV-VI: Pupils equal round and reactive to light.  Normal conjugate, extra-ocular eye movements in all directions of gaze.  No nystagmus.  No ptosis***.   V:  Normal facial sensation.    VII:  Normal facial symmetry and  movements.   VIII:  Normal hearing and vestibular function.   IX-X:  Normal palatal movement.   XI:  Normal shoulder shrug and head rotation.   XII:  Normal tongue strength and range of motion, no deviation or fasciculation.  MOTOR:  No atrophy, fasciculations or abnormal movements.  No pronator drift.   Upper Extremity:  Right  Left  Deltoid  5/5   5/5   Biceps  5/5   5/5   Triceps  5/5   5/5   Infraspinatus 5/5  5/5  Medial pectoralis 5/5  5/5  Wrist extensors  5/5   5/5   Wrist flexors  5/5   5/5   Finger extensors  5/5   5/5   Finger flexors  5/5   5/5   Dorsal interossei  5/5   5/5   Abductor pollicis  5/5   5/5   Tone (Ashworth scale)  0  0   Lower Extremity:  Right  Left  Hip flexors  5/5   5/5   Hip extensors  5/5   5/5   Adductor 5/5  5/5  Abductor 5/5  5/5  Knee flexors  5/5   5/5   Knee extensors  5/5   5/5   Dorsiflexors  5/5   5/5   Plantarflexors  5/5   5/5   Toe extensors  5/5   5/5   Toe flexors  5/5   5/5   Tone (Ashworth scale)  0  0   MSRs:  Right        Left                  brachioradialis 2+  2+  biceps 2+  2+   triceps 2+  2+  patellar 2+  2+  ankle jerk 2+  2+  Hoffman no  no  plantar response down  down   SENSORY:  Normal and symmetric perception of light touch, pinprick, vibration, and proprioception.  Romberg's sign absent.   COORDINATION/GAIT: Normal finger-to- nose-finger and heel-to-shin.  Intact rapid alternating movements bilaterally.  Able to rise from a chair without using arms.  Gait narrow based and stable. Tandem and stressed gait intact.    IMPRESSION: ***  PLAN/RECOMMENDATIONS:  *** Return to clinic in *** months.  Total time spent: ***   Thank you for allowing me to participate in patient's care.  If I can answer any additional questions, I would be pleased to do so.    Sincerely,    Zonia Caplin K. Allena Katz, DO

## 2021-06-19 ENCOUNTER — Encounter: Payer: Self-pay | Admitting: Neurology

## 2021-06-19 ENCOUNTER — Ambulatory Visit: Payer: BC Managed Care – PPO | Admitting: Neurology

## 2021-07-17 ENCOUNTER — Encounter: Payer: Self-pay | Admitting: Gastroenterology

## 2021-07-21 ENCOUNTER — Other Ambulatory Visit: Payer: Self-pay

## 2021-07-21 ENCOUNTER — Ambulatory Visit (AMBULATORY_SURGERY_CENTER): Payer: Self-pay

## 2021-07-21 VITALS — Ht 68.0 in | Wt 147.0 lb

## 2021-07-21 DIAGNOSIS — Z1211 Encounter for screening for malignant neoplasm of colon: Secondary | ICD-10-CM

## 2021-07-21 NOTE — Progress Notes (Signed)
Denies allergies to eggs or soy products. Denies complication of anesthesia or sedation. Denies use of weight loss medication. Denies use of O2.   Emmi instructions given for colonoscopy.  

## 2021-08-03 ENCOUNTER — Encounter (HOSPITAL_BASED_OUTPATIENT_CLINIC_OR_DEPARTMENT_OTHER): Payer: Self-pay | Admitting: *Deleted

## 2021-08-03 ENCOUNTER — Emergency Department (HOSPITAL_BASED_OUTPATIENT_CLINIC_OR_DEPARTMENT_OTHER): Payer: BC Managed Care – PPO

## 2021-08-03 ENCOUNTER — Other Ambulatory Visit: Payer: Self-pay

## 2021-08-03 ENCOUNTER — Emergency Department (HOSPITAL_BASED_OUTPATIENT_CLINIC_OR_DEPARTMENT_OTHER)
Admission: EM | Admit: 2021-08-03 | Discharge: 2021-08-03 | Disposition: A | Discharge status: Home / Self Care | Payer: BC Managed Care – PPO | Attending: Emergency Medicine | Admitting: Emergency Medicine

## 2021-08-03 DIAGNOSIS — S81812A Laceration without foreign body, left lower leg, initial encounter: Secondary | ICD-10-CM | POA: Diagnosis not present

## 2021-08-03 DIAGNOSIS — S81811A Laceration without foreign body, right lower leg, initial encounter: Secondary | ICD-10-CM

## 2021-08-03 DIAGNOSIS — X58XXXA Exposure to other specified factors, initial encounter: Secondary | ICD-10-CM | POA: Insufficient documentation

## 2021-08-03 DIAGNOSIS — S8992XA Unspecified injury of left lower leg, initial encounter: Secondary | ICD-10-CM | POA: Diagnosis present

## 2021-08-03 MED ORDER — LIDOCAINE-EPINEPHRINE-TETRACAINE (LET) TOPICAL GEL
3.0000 mL | Freq: Once | TOPICAL | Status: AC
  Administered 2021-08-03: 3 mL via TOPICAL
  Filled 2021-08-03: qty 3

## 2021-08-03 MED ORDER — CEPHALEXIN 500 MG PO CAPS: 500.0000 mg | ORAL_CAPSULE | Freq: Four times a day (QID) | ORAL | 0 refills | Status: DC

## 2021-08-03 NOTE — ED Notes (Signed)
Dsg on left lower leg, provided additional guaze and sterile dsg pad. Reviewed AVS with client in regards to monitoring for infection. Also informed that a Rx was sent to her pharmacy electronically and to take the abx as prescribed and to complete all the abx as well. Informed that she has dissolvable sutures, so she would not need to return for suture removal, but to please follow up with her Primary Care MD for post injury follow up. Opportunity for questions provided prior to be DC to home

## 2021-08-03 NOTE — ED Notes (Signed)
Client states she slipped on wooden floor last pm and left foot went under a metal type piece of furniture, bleeding is controlled at this time, there appears to be a skin tear/ laceration at base of left anterior leg. Irregular in shape. Approx 1.5cm in length. Area cleaned for further visual assessment by ED provider

## 2021-08-03 NOTE — ED Triage Notes (Signed)
Laceration to her left lower leg last night. Bleeding controlled. Pt is ambulatory.

## 2021-08-03 NOTE — ED Provider Notes (Signed)
[facility] EMERGENCY DEPARTMENT Provider Note   CSN: [id number] Arrival date & time: 08/03/21  1225     History  Chief Complaint  Patient presents with   Laceration    Natalie Yang is a 51 y.o. female.   Laceration  Patient with known contributable medical history is presenting due to left lower extremity laceration.  She fell yesterday at 9 PM, has been having pain to the anterior tib-fib since then.  Worse with ambulation, has a gaping laceration to the left anterior shin.  Her tetanus was updated last year.  Home Medications Prior to Admission medications   Medication Sig Start Date End Date Taking? Authorizing Provider  acetaminophen (TYLENOL) 325 MG tablet Take 2 tablets (650 mg total) by mouth every 6 (six) hours as needed. 06/09/21   Sloan Leiter, DO  acyclovir (ZOVIRAX) 200 MG capsule Take 200 mg by mouth daily.    [provider]  albuterol (PROVENTIL HFA;VENTOLIN HFA) 108 (90 BASE) MCG/ACT inhaler Inhale 1-2 puffs into the lungs every 6 (six) hours as needed for wheezing or shortness of breath.    [provider]  amphetamine-dextroamphetamine (ADDERALL XR) 30 MG 24 hr capsule Take 30 mg by mouth 2 (two) times daily. 12/05/20   [provider]  busPIRone (BUSPAR) 30 MG tablet Take 30 mg by mouth 2 (two) times daily as needed for anxiety. 12/05/20   [provider]  butalbital-acetaminophen-caffeine (FIORICET, ESGIC) 50-325-40 MG per tablet Take 1 tablet by mouth 2 (two) times daily as needed for headache or migraine.     [provider]  cetirizine (ZYRTEC) 10 MG tablet Take 10 mg by mouth daily.    [provider]  desvenlafaxine (PRISTIQ) 100 MG 24 hr tablet Take 200 mg by mouth daily. 08/01/20   [provider]  Echinacea-Goldenseal (ECHINACEA COMB/GOLDEN SEAL PO) Take 1 tablet by mouth daily.    [provider]  fluticasone-salmeterol (ADVAIR) 100-50 MCG/ACT AEPB Inhale 1 puff into  the lungs 2 (two) times daily. 08/16/18   [provider]  gabapentin (NEURONTIN) 300 MG capsule Take 300 mg by mouth at bedtime. 10/06/20   [provider]  Glycerin-Hypromellose-PEG 400 (DRY EYE RELIEF DROPS) 0.2-0.2-1 % SOLN Place 1 drop into both eyes daily as needed (dry eyes).    [provider]  hydrOXYzine (ATARAX/VISTARIL) 50 MG tablet Take 50-150 mg by mouth every 6 (six) hours as needed for anxiety or itching. 11/25/20   [provider]  ibuprofen (ADVIL) 600 MG tablet Take 1 tablet (600 mg total) by mouth every 6 (six) hours as needed. 06/09/21   Sloan Leiter, DO  ibuprofen (ADVIL,MOTRIN) 800 MG tablet Take 800 mg by mouth at bedtime as needed (back pain).    [provider]  Melatonin 10 MG TABS Take 10 mg by mouth at bedtime.    [provider]  methocarbamol (ROBAXIN) 500 MG tablet Take 1 tablet (500 mg total) by mouth 4 (four) times daily. Patient not taking: Reported on 07/21/2021 12/12/20   Zadie Cleverly R, PA  oxyCODONE (OXY IR/ROXICODONE) 5 MG immediate release tablet Take 5 mg by mouth every 6 (six) hours as needed for pain. Patient not taking: Reported on 07/21/2021 12/10/20   [provider]  oxyCODONE-acetaminophen (PERCOCET) 10-325 MG tablet Take 1 tablet by mouth 3 (three) times daily. 01/16/20   [provider]  oxymetazoline (AFRIN 12 HOUR) 0.05 % nasal spray Place 1 spray into both nostrils 2 (two) times daily.  Patient not taking: Reported on 07/21/2021    [provider]  polyethylene glycol powder (GLYCOLAX/MIRALAX) 17 GM/SCOOP powder Take 1 Container by mouth once.    [provider]  SUMAtriptan (IMITREX) 100 MG tablet Take 100 mg by mouth every 2 (two) hours as needed for migraine. May repeat in 2 hours if headache persists or recurs.    [provider]  traMADol (ULTRAM) 50 MG tablet Take 1 tablet (50 mg total) by mouth every 6 (six) hours as needed. Patient not taking: Reported  on 07/21/2021 12/12/20 12/12/21  Cherie Dark, PA  traZODone (DESYREL) 100 MG tablet Take 100 mg by mouth at bedtime as needed for sleep. Patient not taking: Reported on 07/21/2021 12/05/20   [provider]  triamcinolone cream (KENALOG) 0.5 % Apply 1 application topically at bedtime as needed (dry skin on hands).    [provider]      Allergies    Patient has no known allergies.    Review of Systems   Review of Systems  Skin:  Positive for wound.   Physical Exam Updated Vital Signs BP 128/87 (BP Location: Right Arm)    Pulse 71    Temp 98 F (36.7 C) (Oral)    Resp 19    Ht 5\' 8"  (1.727 m)    Wt 66.7 kg    SpO2 97%    BMI 22.36 kg/m  Physical Exam Vitals and nursing note reviewed. Exam conducted with a chaperone present.  Constitutional:      General: She is not in acute distress.    Appearance: Normal appearance.  HENT:     Head: Normocephalic and atraumatic.  Eyes:     General: No scleral icterus.    Extraocular Movements: Extraocular movements intact.     Pupils: Pupils are equal, round, and reactive to light.  Cardiovascular:     Pulses: Normal pulses.  Musculoskeletal:        General: Tenderness present. Normal range of motion.     Comments: Compartments are soft.  Skin:    Capillary Refill: Capillary refill takes less than 2 seconds.     Coloration: Skin is not jaundiced.     Comments: Laceration to left anterior shin as below.  Neurological:     Mental Status: She is alert. Mental status is at baseline.     Coordination: Coordination normal.     ED Results / Procedures / Treatments   Labs (all labs ordered are listed, but only abnormal results are displayed) Labs Reviewed - No data to display  EKG None  Radiology DG Tibia/Fibula Left  Result Date: 08/03/2021 CLINICAL DATA:  Left leg pain after fall EXAM: LEFT TIBIA AND FIBULA - 2 VIEW COMPARISON:  None. FINDINGS: There is no evidence of fracture or other focal bone lesions. No  malalignment. Soft tissues are unremarkable. IMPRESSION: Negative. Electronically Signed   By: 08/05/2021 D.O.   On: 08/03/2021 16:44    Procedures .02/22/2023Laceration Repair  Date/Time: 08/03/2021 5:33 PM Performed by: 08/05/2021, PA-C Authorized by: Theron Arista, PA-C   Consent:    Consent obtained:  Verbal   Consent given by:  Patient   Risks discussed:  Infection, need for additional repair, pain, poor cosmetic result and poor wound healing   Alternatives discussed:  No treatment and delayed treatment Universal protocol:    Procedure explained and questions answered to patient or proxy's satisfaction: yes     Relevant documents present and verified: yes  Test results available: yes     Imaging studies available: yes     Required blood products, implants, devices, and special equipment available: yes     Site/side marked: yes     Immediately prior to procedure, a time out was called: yes     Patient identity confirmed:  Verbally with patient Anesthesia:    Anesthesia method:  Topical application Laceration details:    Location:  Leg   Leg location:  L lower leg   Length (cm):  3   Depth (mm):  2 Pre-procedure details:    Preparation:  Patient was prepped and draped in usual sterile fashion and imaging obtained to evaluate for foreign bodies Exploration:    Hemostasis achieved with:  Direct pressure   Imaging outcome: foreign body not noted     Wound exploration: wound explored through full range of motion and entire depth of wound visualized     Contaminated: yes (>12 hours since occurance)   Treatment:    Area cleansed with:  Povidone-iodine   Amount of cleaning:  Extensive   Irrigation solution:  Sterile saline   Irrigation volume:  1L   Irrigation method:  Pressure wash   Visualized foreign bodies/material removed: no     Debridement:  None   Undermining:  None   Scar revision: no   Skin repair:    Repair method:  Sutures   Suture size:  4-0   Suture material:   Chromic gut   Suture technique:  Simple interrupted   Number of sutures:  2 Approximation:    Approximation:  Loose Repair type:    Repair type:  Simple Post-procedure details:    Dressing:  Antibiotic ointment and non-adherent dressing   Procedure completion:  Tolerated well, no immediate complications    Medications Ordered in ED Medications  lidocaine-EPINEPHrine-tetracaine (LET) topical gel (3 mLs Topical Given 08/03/21 1629)    ED Course/ Medical Decision Making/ A&P                           Medical Decision Making Amount and/or Complexity of Data Reviewed Radiology: ordered.   This patient presenting with left lower extremity pain and laceration.  Differential diagnosis includes but is not limited to laceration, compartment syndrome, tibial or fibular fracture.  Neurovascularly intact, compartments are soft.  Active range of motion intact to the ankle, knee.  Tenderness over the anterior tibial tuberosity.  I ordered x-ray to evaluate for possible occult fracture given tenderness over the tibial plateau, x-ray was viewed by myself and I did not find any evidence of fracture.  Patient tetanus is up-to-date per chart review, done 1 year ago.  The wound has been open for more than 12 hours it is technically contaminated.  I considered letting it closed by secondary intent, patient expresses desire to close it in ED given the gaping nature of the wound..  I explained the risk of infection as well as potential risks with that plan.  Patient verbalizes that the risk include infection, poor cosmetic outcome despite sutures.  Patient verbalized understanding.  Very loose repair.   Patient tolerated the repair well, we discussed tricked return precautions.  Given this no more than 12 hours since repair will rx ABX prophylaxis.  I feel patient is stable for discharge and does not require additional workup at this time.         Final Clinical Impression(s) / ED  Diagnoses Final diagnoses:  None  Rx / DC Orders ED Discharge Orders     None         Theron AristaSage, Rosi Secrist, [place] JerseyPA-C 08/03/21 1739    Pollyann SavoySheldon, Charles B, MD 08/03/21 [id number]

## 2021-08-03 NOTE — Discharge Instructions (Addendum)
Take Keflex 4 times daily for the next 5 days.  Take Tylenol and Motrin as needed for pain.  After 24 hours you can take the bandage off and clean with soap and water.  Like topical antibiotic ointment, return if you have signs of infection after 48 hours.  Follow-up with PCP in 1 week for wound recheck.

## 2021-08-10 ENCOUNTER — Encounter: Payer: Self-pay | Admitting: Gastroenterology

## 2021-08-13 ENCOUNTER — Other Ambulatory Visit: Payer: Self-pay

## 2021-08-13 ENCOUNTER — Ambulatory Visit (AMBULATORY_SURGERY_CENTER): Payer: BC Managed Care – PPO | Admitting: Gastroenterology

## 2021-08-13 ENCOUNTER — Encounter: Payer: Self-pay | Admitting: Gastroenterology

## 2021-08-13 VITALS — BP 113/83 | HR 84 | Temp 98.6°F | Resp 15 | Ht 68.0 in | Wt 148.0 lb

## 2021-08-13 DIAGNOSIS — Z1211 Encounter for screening for malignant neoplasm of colon: Secondary | ICD-10-CM

## 2021-08-13 MED ORDER — SODIUM CHLORIDE 0.9 % IV SOLN: 500.0000 mL | INTRAVENOUS | Status: DC

## 2021-08-13 NOTE — Progress Notes (Signed)
Pt's states no medical or surgical changes since previsit or office visit. 

## 2021-08-13 NOTE — Progress Notes (Signed)
? ?Referring Provider: Aida Puffer, MD ?Primary Care Physician:  Aida Puffer, MD ? ?Reason for Procedure:  Colon cancer screening ? ? ?IMPRESSION:  ?Need for colon cancer screening ?Appropriate candidate for monitored anesthesia care ? ?PLAN: ?Colonoscopy in the [facility] today ? ? ?HPI: Natalie Yang is a 51 y.o. female presents for screening colonoscopy. ? ?No prior colonoscopy or colon cancer screening. ? ?No baseline GI symptoms.  ? ?No known family history of colon cancer or polyps. No family history of uterine/endometrial cancer, pancreatic cancer or gastric/stomach cancer. ? ? ?Past Medical History:  ?Diagnosis Date  ? Allergy   ? Anxiety   ? Arthritis   ? Asthma   ? GERD (gastroesophageal reflux disease)   ? Headache   ? migraines  ? PONV (postoperative nausea and vomiting)   ? Vaginal delivery 06/15/1995  ? ? ?Past Surgical History:  ?Procedure Laterality Date  ? BLADDER SUSPENSION N/A 08/30/2014  ? Procedure: TRANSVAGINAL TAPE (TVT) PROCEDURE;  Surgeon: Lavina Hamman, MD;  Location: [facility];  Service: Gynecology;  Laterality: N/A;  ? CHOLECYSTECTOMY    ? CYSTOSCOPY N/A 08/30/2014  ? Procedure: CYSTOSCOPY;  Surgeon: Lavina Hamman, MD;  Location: [facility];  Service: Gynecology;  Laterality: N/A;  ? HYSTEROSCOPY WITH NOVASURE N/A 08/30/2014  ? Procedure: HYSTEROSCOPY WITH NOVASURE;  Surgeon: Lavina Hamman, MD;  Location: [facility];  Service: Gynecology;  Laterality: N/A;  Dr only needs 1 1/2hrs OR time  ? INCISION AND DRAINAGE ABSCESS Right 12/11/2020  ? Procedure: ARTHROSCOPIC INCISION AND DRAINAGE KNEE ABSCESS;  Surgeon: Eugenia Mcalpine, MD;  Location: [facility];  Service: Orthopedics;  Laterality: Right;  ? IUD REMOVAL N/A 08/30/2014  ? Procedure: INTRAUTERINE DEVICE (IUD) REMOVAL;  Surgeon: Lavina Hamman, MD;  Location: [facility];  Service: Gynecology;  Laterality: N/A;  ? KNEE SURGERY    ? LAPAROSCOPIC TUBAL LIGATION Bilateral 08/30/2014  ? Procedure: LAPAROSCOPIC TUBAL LIGATION;  Surgeon: Lavina Hamman, MD;   Location: [facility];  Service: Gynecology;  Laterality: Bilateral;  ? NOSE SURGERY    ? x 5  ? thumb repair Right   ? uterine ablation    ? ? ?Current Outpatient Medications  ?Medication Sig Dispense Refill  ? acetaminophen (TYLENOL) 325 MG tablet Take 2 tablets (650 mg total) by mouth every 6 (six) hours as needed. 36 tablet 0  ? acyclovir (ZOVIRAX) 200 MG capsule Take 200 mg by mouth daily.    ? albuterol (PROVENTIL HFA;VENTOLIN HFA) 108 (90 BASE) MCG/ACT inhaler Inhale 1-2 puffs into the lungs every 6 (six) hours as needed for wheezing or shortness of breath.    ? amphetamine-dextroamphetamine (ADDERALL XR) 30 MG 24 hr capsule Take 30 mg by mouth 2 (two) times daily.    ? busPIRone (BUSPAR) 30 MG tablet Take 30 mg by mouth 2 (two) times daily as needed for anxiety.    ? butalbital-acetaminophen-caffeine (FIORICET, ESGIC) 50-325-40 MG per tablet Take 1 tablet by mouth 2 (two) times daily as needed for headache or migraine.     ? cephALEXin (KEFLEX) 500 MG capsule Take 1 capsule (500 mg total) by mouth 4 (four) times daily. 20 capsule 0  ? cetirizine (ZYRTEC) 10 MG tablet Take 10 mg by mouth daily.    ? desvenlafaxine (PRISTIQ) 100 MG 24 hr tablet Take 200 mg by mouth daily.    ? Echinacea-Goldenseal (ECHINACEA COMB/GOLDEN SEAL PO) Take 1 tablet by mouth daily.    ? fluticasone-salmeterol (ADVAIR) 100-50 MCG/ACT AEPB Inhale 1 puff into the lungs 2 (two) times  daily.    ? gabapentin (NEURONTIN) 300 MG capsule Take 300 mg by mouth at bedtime.    ? Glycerin-Hypromellose-PEG 400 (DRY EYE RELIEF DROPS) 0.2-0.2-1 % SOLN Place 1 drop into both eyes daily as needed (dry eyes).    ? hydrOXYzine (ATARAX/VISTARIL) 50 MG tablet Take 50-150 mg by mouth every 6 (six) hours as needed for anxiety or itching.    ? ibuprofen (ADVIL) 600 MG tablet Take 1 tablet (600 mg total) by mouth every 6 (six) hours as needed. 30 tablet 0  ? ibuprofen (ADVIL,MOTRIN) 800 MG tablet Take 800 mg by mouth at bedtime as needed (back pain).    ? Melatonin  10 MG TABS Take 10 mg by mouth at bedtime.    ? methocarbamol (ROBAXIN) 500 MG tablet Take 1 tablet (500 mg total) by mouth 4 (four) times daily. (Patient not taking: Reported on 07/21/2021) 40 tablet 0  ? oxyCODONE (OXY IR/ROXICODONE) 5 MG immediate release tablet Take 5 mg by mouth every 6 (six) hours as needed for pain. (Patient not taking: Reported on 07/21/2021)    ? oxyCODONE-acetaminophen (PERCOCET) 10-325 MG tablet Take 1 tablet by mouth 3 (three) times daily.    ? oxymetazoline (AFRIN 12 HOUR) 0.05 % nasal spray Place 1 spray into both nostrils 2 (two) times daily. (Patient not taking: Reported on 07/21/2021)    ? polyethylene glycol powder (GLYCOLAX/MIRALAX) 17 GM/SCOOP powder Take 1 Container by mouth once.    ? SUMAtriptan (IMITREX) 100 MG tablet Take 100 mg by mouth every 2 (two) hours as needed for migraine. May repeat in 2 hours if headache persists or recurs.    ? traMADol (ULTRAM) 50 MG tablet Take 1 tablet (50 mg total) by mouth every 6 (six) hours as needed. (Patient not taking: Reported on 07/21/2021) 20 tablet 0  ? traZODone (DESYREL) 100 MG tablet Take 100 mg by mouth at bedtime as needed for sleep. (Patient not taking: Reported on 07/21/2021)    ? triamcinolone cream (KENALOG) 0.5 % Apply 1 application topically at bedtime as needed (dry skin on hands).    ? ?Current Facility-Administered Medications  ?Medication Dose Route Frequency Provider Last Rate Last Admin  ? 0.9 %  sodium chloride infusion  500 mL Intravenous Continuous Tressia Danas, MD      ? ? ?Allergies as of 08/13/2021  ? (No Known Allergies)  ? ? ?Family History  ?Problem Relation Age of Onset  ? Colon cancer Neg Hx   ? Esophageal cancer Neg Hx   ? Rectal cancer Neg Hx   ? Stomach cancer Neg Hx   ? ? ? ?Physical Exam: ?General:   Alert,  well-nourished, pleasant and cooperative in NAD ?Head:  Normocephalic and atraumatic. ?Eyes:  Sclera clear, no icterus.   Conjunctiva pink. ?Mouth:  No deformity or lesions.   ?Neck:  Supple; no masses  or thyromegaly. ?Lungs:  Clear throughout to auscultation.   No wheezes. ?Heart:  Regular rate and rhythm; no murmurs. ?Abdomen:  Soft, non-tender, nondistended, normal bowel sounds, no rebound or guarding.  ?Msk:  Symmetrical. No boney deformities ?LAD: No inguinal or umbilical LAD ?Extremities:  No clubbing or edema. ?Neurologic:  Alert and  oriented x4;  grossly nonfocal ?Skin:  No obvious rash or bruise. ?Psych:  Alert and cooperative. Normal mood and affect. ? ? ? ? ?Studies/Results: ?No results found. ? ? ? ?Natalie Yang L. Orvan Falconer, MD, MPH ?08/13/2021, 2:05 PM ? ? ? ?  ?

## 2021-08-13 NOTE — Patient Instructions (Signed)
Please read handouts provided. Continue present medications. High Fiber Diet.    YOU HAD AN ENDOSCOPIC PROCEDURE TODAY AT THE [facility]:   Refer to the procedure report that was given to you for any specific questions about what was found during the examination.  If the procedure report does not answer your questions, please call your gastroenterologist to clarify.  If you requested that your care partner not be given the details of your procedure findings, then the procedure report has been included in a sealed envelope for you to review at your convenience later.  YOU SHOULD EXPECT: Some feelings of bloating in the abdomen. Passage of more gas than usual.  Walking can help get rid of the air that was put into your GI tract during the procedure and reduce the bloating. If you had a lower endoscopy (such as a colonoscopy or flexible sigmoidoscopy) you may notice spotting of blood in your stool or on the toilet paper. If you underwent a bowel prep for your procedure, you may not have a normal bowel movement for a few days.  Please Note:  You might notice some irritation and congestion in your nose or some drainage.  This is from the oxygen used during your procedure.  There is no need for concern and it should clear up in a day or so.  SYMPTOMS TO REPORT IMMEDIATELY:   Following lower endoscopy (colonoscopy or flexible sigmoidoscopy):  Excessive amounts of blood in the stool  Significant tenderness or worsening of abdominal pains  Swelling of the abdomen that is new, acute  Fever of 100F or higher    For urgent or emergent issues, a gastroenterologist can be reached at any hour by calling [phone]. Do not use MyChart messaging for urgent concerns.    DIET:  We do recommend a small meal at first, but then you may proceed to your regular diet.  Drink plenty of fluids but you should avoid alcoholic beverages for 24 hours.  ACTIVITY:  You should plan to take it easy for  the rest of today and you should NOT DRIVE or use heavy machinery until tomorrow (because of the sedation medicines used during the test).    FOLLOW UP: Our staff will call the number listed on your records 48-72 hours following your procedure to check on you and address any questions or concerns that you may have regarding the information given to you following your procedure. If we do not reach you, we will leave a message.  We will attempt to reach you two times.  During this call, we will ask if you have developed any symptoms of COVID 19. If you develop any symptoms (ie: fever, flu-like symptoms, shortness of breath, cough etc.) before then, please call [phone].  If you test positive for Covid 19 in the 2 weeks post procedure, please call and report this information to us.    If any biopsies were taken you will be contacted by phone or by letter within the next 1-3 weeks.  Please call us at [phone] if you have not heard about the biopsies in 3 weeks.    SIGNATURES/CONFIDENTIALITY: You and/or your care partner have signed paperwork which will be entered into your electronic medical record.  These signatures attest to the fact that that the information above on your After Visit Summary has been reviewed and is understood.  Full responsibility of the confidentiality of this discharge information lies with you and/or your care-partner. 

## 2021-08-13 NOTE — Op Note (Signed)
[facility] ?Patient Name: Natalie Yang ?Procedure Date: 08/13/2021 2:21 PM ?MRN: [id number] ?Endoscopist: Tressia Danas MD, MD ?Age: 51 ?Referring MD:  ?Date of Birth: 1971/01/25 ?Gender: Female ?Account #: [id number] ?Procedure:                Colonoscopy ?Indications:              Screening for colorectal malignant neoplasm, This  ?                          is the patient's first colonoscopy ?                          No known family history of colon cancer or polyps ?Medicines:                Monitored Anesthesia Care ?Procedure:                Pre-Anesthesia Assessment: ?                          - Prior to the procedure, a History and Physical  ?                          was performed, and patient medications and  ?                          allergies were reviewed. The patient's tolerance of  ?                          previous anesthesia was also reviewed. The risks  ?                          and benefits of the procedure and the sedation  ?                          options and risks were discussed with the patient.  ?                          All questions were answered, and informed consent  ?                          was obtained. Prior Anticoagulants: The patient has  ?                          taken no previous anticoagulant or antiplatelet  ?                          agents. ASA Grade Assessment: II - A patient with  ?                          mild systemic disease. After reviewing the risks  ?                          and benefits, the patient was deemed in  ?  satisfactory condition to undergo the procedure. ?                          After obtaining informed consent, the colonoscope  ?                          was passed under direct vision. Throughout the  ?                          procedure, the patient's blood pressure, pulse, and  ?                          oxygen saturations were monitored continuously. The  ?                          Olympus CF-HQ190L  ([id number]) Colonoscope was  ?                          introduced through the anus and advanced to the 2  ?                          cm into the ileum. The colonoscopy was performed  ?                          without difficulty. The patient tolerated the  ?                          procedure well. The quality of the bowel  ?                          preparation was good. Approximately of liquid  ?                          was removed during the procedure. The terminal  ?                          ileum, ileocecal valve, appendiceal orifice, and  ?                          rectum were photographed. ?Scope In: 2:26:38 PM ?Scope Out: 2:42:28 PM ?Scope Withdrawal Time: 0 hours 10 minutes 53 seconds  ?Total Procedure Duration: 0 hours 15 minutes 50 seconds  ?Findings:                 The perianal and digital rectal examinations were  ?                          normal. ?                          A few small and large-mouthed diverticula were  ?                          found in the sigmoid colon. ?  The exam was otherwise without abnormality on  ?                          direct and retroflexion views. ?Complications:            No immediate complications. ?Estimated Blood Loss:     Estimated blood loss: none. ?Impression:               - Diverticulosis in the sigmoid colon. ?                          - The examination was otherwise normal on direct  ?                          and retroflexion views. ?                          - No specimens collected. ?Recommendation:           - Patient has a contact number available for  ?                          emergencies. The signs and symptoms of potential  ?                          delayed complications were discussed with the  ?                          patient. Return to normal activities tomorrow.  ?                          Written discharge instructions were provided to the  ?                          patient. ?                          - High  fiber diet. ?                          - Continue present medications. ?                          - Repeat colonoscopy in 10 years for surveillance,  ?                          earlier with new symptoms. Use a different bowel  ?                          prep at that time. ?                          - Follow a high fiber diet. Drink at least 64  ?                          ounces of water daily. Add a daily stool bulking  ?  agent such as psyllium (an exampled would be  ?                          Metamucil). ?                          - Emerging evidence supports eating a diet of  ?                          fruits, vegetables, grains, calcium, and yogurt  ?                          while reducing red meat and alcohol may reduce the  ?                          risk of colon cancer. ?                          - Thank you for allowing me to be involved in your  ?                          colon cancer prevention. ?Tressia DanasKimberly Ritisha Deitrick MD, MD ?08/13/2021 2:49:25 PM ?This report has been signed electronically. ?

## 2021-08-13 NOTE — Progress Notes (Signed)
A and O x3. Report to RN. Tolerated MAC anesthesia well.  ?

## 2021-08-17 ENCOUNTER — Telehealth: Payer: Self-pay | Admitting: *Deleted

## 2021-08-17 NOTE — Telephone Encounter (Signed)
Second attempt, left VM.  

## 2021-08-17 NOTE — Telephone Encounter (Signed)
No answer on first attempt follow up call. Left message.  ?

## 2021-08-28 ENCOUNTER — Other Ambulatory Visit: Payer: Self-pay

## 2021-08-28 ENCOUNTER — Ambulatory Visit (INDEPENDENT_AMBULATORY_CARE_PROVIDER_SITE_OTHER): Payer: Self-pay | Admitting: Neurology

## 2021-08-28 ENCOUNTER — Encounter: Payer: Self-pay | Admitting: Neurology

## 2021-08-28 VITALS — BP 129/74 | HR 109 | Ht 68.0 in | Wt 149.0 lb

## 2021-08-28 DIAGNOSIS — R29898 Other symptoms and signs involving the musculoskeletal system: Secondary | ICD-10-CM

## 2021-08-28 NOTE — Progress Notes (Signed)
?[facility] ?Neurology Division ?Clinic Note - Initial Visit ? ? ?Date: 08/28/21 ? ?[name] ?MRN: [id number] ?DOB: 08/12/1970 ? ? ?Dear Dr. Ninetta Lights: ? ?Thank you for your kind referral of Natalie Yang for consultation of right arm weakness. Although her history is well known to you, please allow [place] to reiterate it for the purpose of our medical record. The patient was accompanied to the clinic by self. ?  ? ?History of Present Illness: ?Natalie Yang is a 51 y.o. right-handed female with GAD, GERD, and asthma presenting for evaluation of right arm weakness.  Patient was hospitalized in July 2022 for right knee infection which required IV antibiotic via PICC line.  At her follow-up with ID in August, she mentioned that her right arm was very weak, specifically, she was unable to raise it.  She was evaluated by Dr. 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  She decided to keep her appointment with me because she also has concern of memory loss, difficulty with concentration/processing.  She endorses being under a significant amount of stress and has high anxiety.  She is concerned about dementia because she is caring for her parents with it.  Her anxiety is also causing her to breakout with skin lesions.  She lives with her husband and able to perform all ADLs and IADLs.   ? ? ?Out-side paper records, electronic medical record, and images have been reviewed where available and summarized as: ?MRI lumbar spine 11/06/2020: ?6 mm anterolisthesis L4-5. 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 Moderate right foraminal ?stenosis. Mild spinal stenosis ?  ?Mild subarticular and foraminal stenosis bilaterally L5-S1 due to ?spurring. ? ? ? ?Past Medical History:  ?Diagnosis Date  ? Allergy   ? Anxiety   ? Arthritis   ? Asthma   ? GERD (gastroesophageal reflux disease)   ? Headache   ? migraines  ? PONV  (postoperative nausea and vomiting)   ? Vaginal delivery 06/15/1995  ? ? ?Past Surgical History:  ?Procedure Laterality Date  ? BLADDER SUSPENSION N/A 08/30/2014  ? Procedure: TRANSVAGINAL TAPE (TVT) PROCEDURE;  Surgeon: Lavina Hamman, MD;  Location: [facility];  Service: Gynecology;  Laterality: N/A;  ? CHOLECYSTECTOMY    ? CYSTOSCOPY N/A 08/30/2014  ? Procedure: CYSTOSCOPY;  Surgeon: Lavina Hamman, MD;  Location: [facility];  Service: Gynecology;  Laterality: N/A;  ? HYSTEROSCOPY WITH NOVASURE N/A 08/30/2014  ? Procedure: HYSTEROSCOPY WITH NOVASURE;  Surgeon: Lavina Hamman, MD;  Location: [facility];  Service: Gynecology;  Laterality: N/A;  Dr only needs 1 1/2hrs OR time  ? INCISION AND DRAINAGE ABSCESS Right 12/11/2020  ? Procedure: ARTHROSCOPIC INCISION AND DRAINAGE KNEE ABSCESS;  Surgeon: Eugenia Mcalpine, MD;  Location: [facility];  Service: Orthopedics;  Laterality: Right;  ? IUD REMOVAL N/A 08/30/2014  ? Procedure: INTRAUTERINE DEVICE (IUD) REMOVAL;  Surgeon: Lavina Hamman, MD;  Location: [facility];  Service: Gynecology;  Laterality: N/A;  ? KNEE SURGERY    ? LAPAROSCOPIC TUBAL LIGATION Bilateral 08/30/2014  ? Procedure: LAPAROSCOPIC TUBAL LIGATION;  Surgeon: Lavina Hamman, MD;  Location: [facility];  Service: Gynecology;  Laterality: Bilateral;  ? NOSE SURGERY    ? x 5  ? thumb repair Right   ? uterine ablation    ? ? ? ?Medications:  ?Outpatient Encounter Medications as of 08/28/2021  ?Medication Sig  ? acetaminophen (TYLENOL) 325 MG tablet Take 2 tablets (650 mg total)  by mouth every 6 (six) hours as needed.  ? acyclovir (ZOVIRAX) 200 MG capsule Take 200 mg by mouth daily.  ? albuterol (PROVENTIL HFA;VENTOLIN HFA) 108 (90 BASE) MCG/ACT inhaler Inhale 1-2 puffs into the lungs every 6 (six) hours as needed for wheezing or shortness of breath.  ? amphetamine-dextroamphetamine (ADDERALL XR) 30 MG 24 hr capsule Take 30 mg by mouth 2 (two) times daily.  ? busPIRone (BUSPAR) 30 MG tablet Take 30 mg by mouth 2 (two) times daily as  needed for anxiety.  ? butalbital-acetaminophen-caffeine (FIORICET, ESGIC) 50-325-40 MG per tablet Take 1 tablet by mouth 2 (two) times daily as needed for headache or migraine.   ? cetirizine (ZYRTEC) 10 MG tablet Take 10 mg by mouth daily.  ? desvenlafaxine (PRISTIQ) 100 MG 24 hr tablet Take 200 mg by mouth daily.  ? Echinacea-Goldenseal (ECHINACEA COMB/GOLDEN SEAL PO) Take 1 tablet by mouth daily.  ? fluticasone-salmeterol (ADVAIR) 100-50 MCG/ACT AEPB Inhale 1 puff into the lungs 2 (two) times daily.  ? gabapentin (NEURONTIN) 300 MG capsule Take 300 mg by mouth at bedtime.  ? Glycerin-Hypromellose-PEG 400 (DRY EYE RELIEF DROPS) 0.2-0.2-1 % SOLN Place 1 drop into both eyes daily as needed (dry eyes).  ? hydrOXYzine (ATARAX/VISTARIL) 50 MG tablet Take 50-150 mg by mouth every 6 (six) hours as needed for anxiety or itching.  ? ibuprofen (ADVIL) 600 MG tablet Take 1 tablet (600 mg total) by mouth every 6 (six) hours as needed.  ? Melatonin 10 MG TABS Take 10 mg by mouth at bedtime.  ? oxyCODONE-acetaminophen (PERCOCET) 10-325 MG tablet Take 1 tablet by mouth 3 (three) times daily.  ? SUMAtriptan (IMITREX) 100 MG tablet Take 100 mg by mouth every 2 (two) hours as needed for migraine. May repeat in 2 hours if headache persists or recurs.  ? triamcinolone cream (KENALOG) 0.5 % Apply 1 application topically at bedtime as needed (dry skin on hands).  ? [DISCONTINUED] polyethylene glycol powder (GLYCOLAX/MIRALAX) 17 GM/SCOOP powder Take 1 Container by mouth once. (Patient not taking: Reported on 08/28/2021)  ? ?No facility-administered encounter medications on file as of 08/28/2021.  ? ? ?Allergies: No Known Allergies ? ?Family History: ?Family History  ?Problem Relation Age of Onset  ? Lung cancer Mother   ?     Stage 4 Lung Cancer  ? Dementia Father   ? Dementia Paternal Aunt   ? Parkinson's disease Paternal Grandmother   ? Colon cancer Neg Hx   ? Esophageal cancer Neg Hx   ? Rectal cancer Neg Hx   ? Stomach cancer Neg Hx    ? ? ?Social History: ?Social History  ? ?Tobacco Use  ? Smoking status: Former  ? Smokeless tobacco: Never  ?Substance Use Topics  ? Alcohol use: Yes  ?  Comment: socially  ? Drug use: No  ? ?Social History  ? ?Social History Narrative  ? Right Handed   ? Lives in a one story home with a sunken living room   ? ? ?Vital Signs:  ?BP 129/74   Pulse (!) 109   Ht 5\' 8"  (1.727 m)   Wt 149 lb (67.6 kg)   SpO2 99%   BMI 22.66 kg/m?  ? ?Neurological Exam: ?MENTAL STATUS including orientation to time, place, person, recent and remote memory, attention span and concentration, language, and fund of knowledge is normal.  Speech is not dysarthric. ? ?CRANIAL NERVES: ?II:  No visual field defects.   ?III-IV-VI: Pupils equal round and reactive to light.  Normal conjugate, extra-ocular eye movements in all directions of gaze.  No nystagmus.  No ptosis.   ?V:  Normal facial sensation.    ?VII:  Normal facial symmetry and movements.   ?VIII:  Normal hearing and vestibular function.   ?IX-X:  Normal palatal movement.   ?XI:  Normal shoulder shrug and head rotation.   ?XII:  Normal tongue strength and range of motion, no deviation or fasciculation. ? ?MOTOR:  Shoulder ROM restricted with arm extension and abduction. No atrophy, fasciculations or abnormal movements.  No pronator drift.  ? ?Upper Extremity:  Right  Left  ?Deltoid  5/5   5/5   ?Biceps  5/5   5/5   ?Triceps  5/5   5/5   ?Wrist extensors  5/5   5/5   ?Wrist flexors  5/5   5/5   ?Finger extensors  5/5   5/5   ?Finger flexors  5/5   5/5   ?Dorsal interossei  5/5   5/5   ?Abductor pollicis  5/5   5/5   ?Tone (Ashworth scale)  0  0  ? ?Lower Extremity:  Right  Left  ?Hip flexors  5/5   5/5   ?Knee flexors  5/5   5/5   ?Knee extensors  5/5   5/5   ?Dorsiflexors  5/5   5/5   ?Plantarflexors  5/5   5/5   ?Toe extensors  5/5   5/5   ?Toe flexors  5/5   5/5   ?Tone (Ashworth scale)  0  0  ? ?MSRs:  ?Right        Left                  ?brachioradialis 2+  2+  ?biceps 2+  2+   ?triceps 2+  2+  ?patellar 2+  2+  ?ankle jerk 2+  2+  ?Hoffman no  no  ?plantar response down  down  ? ?SENSORY:  Normal and symmetric perception of light touch, pinprick, vibration, and proprioception.   ? ?COORD

## 2021-11-15 ENCOUNTER — Encounter: Payer: Self-pay | Admitting: Infectious Diseases

## 2021-12-09 ENCOUNTER — Ambulatory Visit
Admission: EM | Admit: 2021-12-09 | Discharge: 2021-12-09 | Disposition: A | Discharge status: Home / Self Care | Payer: BC Managed Care – PPO | Attending: Family Medicine | Admitting: Family Medicine

## 2021-12-09 DIAGNOSIS — L309 Dermatitis, unspecified: Secondary | ICD-10-CM

## 2021-12-09 MED ORDER — TRIAMCINOLONE ACETONIDE 40 MG/ML IJ SUSP
40.0000 mg | Freq: Once | INTRAMUSCULAR | Status: AC
  Administered 2021-12-09: 40 mg via INTRAMUSCULAR

## 2021-12-09 MED ORDER — PREDNISONE 20 MG PO TABS: 40.0000 mg | ORAL_TABLET | Freq: Every day | ORAL | 0 refills | Status: AC

## 2021-12-09 NOTE — ED Triage Notes (Signed)
Pt c/o some type of insect bite that occurred gardening this morning. Rash has rapidly spread and now appear to be over one hands length around the area.

## 2021-12-09 NOTE — Discharge Instructions (Addendum)
You have been given a shot of triamcinolone 40 mg tonight  Take prednisone 20 mg--2 daily for 5 days

## 2021-12-09 NOTE — ED Provider Notes (Signed)
[facility]    CSN: [id number] Arrival date & time: 12/09/21  1933      History   Chief Complaint Chief Complaint  Patient presents with   Insect Bite    HPI Natalie Yang is a 51 y.o. female.   HPI Here for redness and some itching and swelling on the posterior distal thigh and behind her left knee.  She was doing some gardening when she felt a sting or a bite and then since then she has had itching and she has had increasing redness and swelling in that area.  No fever.  She maybe feels some itching or some fullness in her mouth.  No trouble breathing.  She has never had an allergy to an insect previously.  No history of diabetes  Past Medical History:  Diagnosis Date   Allergy    Anxiety    Arthritis    Asthma    GERD (gastroesophageal reflux disease)    Headache    migraines   PONV (postoperative nausea and vomiting)    Vaginal delivery 06/15/1995    Patient Active Problem List   Diagnosis Date Noted   Rash 02/26/2021   Anxiety 02/26/2021   Right arm weakness 01/15/2021   Infection of right knee (HCC) 12/11/2020   Migraine 11/01/2013    Past Surgical History:  Procedure Laterality Date   BLADDER SUSPENSION N/A 08/30/2014   Procedure: TRANSVAGINAL TAPE (TVT) PROCEDURE;  Surgeon: Lavina Hamman, MD;  Location: [facility];  Service: Gynecology;  Laterality: N/A;   CHOLECYSTECTOMY     CYSTOSCOPY N/A 08/30/2014   Procedure: CYSTOSCOPY;  Surgeon: Lavina Hamman, MD;  Location: [facility];  Service: Gynecology;  Laterality: N/A;   HYSTEROSCOPY WITH NOVASURE N/A 08/30/2014   Procedure: HYSTEROSCOPY WITH NOVASURE;  Surgeon: Lavina Hamman, MD;  Location: [facility];  Service: Gynecology;  Laterality: N/A;  Dr only needs 1 1/2hrs OR time   INCISION AND DRAINAGE ABSCESS Right 12/11/2020   Procedure: ARTHROSCOPIC INCISION AND DRAINAGE KNEE ABSCESS;  Surgeon: Eugenia Mcalpine, MD;  Location: [facility];  Service: Orthopedics;  Laterality: Right;   IUD REMOVAL N/A  08/30/2014   Procedure: INTRAUTERINE DEVICE (IUD) REMOVAL;  Surgeon: Lavina Hamman, MD;  Location: [facility];  Service: Gynecology;  Laterality: N/A;   KNEE SURGERY     LAPAROSCOPIC TUBAL LIGATION Bilateral 08/30/2014   Procedure: LAPAROSCOPIC TUBAL LIGATION;  Surgeon: Lavina Hamman, MD;  Location: [facility];  Service: Gynecology;  Laterality: Bilateral;   NOSE SURGERY     x 5   thumb repair Right    uterine ablation      OB History   No obstetric history on file.      Home Medications    Prior to Admission medications   Medication Sig Start Date End Date Taking? Authorizing Provider  predniSONE (DELTASONE) 20 MG tablet Take 2 tablets (40 mg total) by mouth daily with breakfast for 5 days. 12/09/21 12/14/21 Yes Evann Koelzer, Janace Aris, MD  acetaminophen (TYLENOL) 325 MG tablet Take 2 tablets (650 mg total) by mouth every 6 (six) hours as needed. 06/09/21   Sloan Leiter, DO  acyclovir (ZOVIRAX) 200 MG capsule Take 200 mg by mouth daily.    [provider]  albuterol (PROVENTIL HFA;VENTOLIN HFA) 108 (90 BASE) MCG/ACT inhaler Inhale 1-2 puffs into the lungs every 6 (six) hours as needed for wheezing or shortness of breath.    [provider]  amphetamine-dextroamphetamine (ADDERALL XR) 30 MG 24 hr capsule Take 30 mg by  mouth 2 (two) times daily. 12/05/20   [provider]  busPIRone (BUSPAR) 30 MG tablet Take 30 mg by mouth 2 (two) times daily as needed for anxiety. 12/05/20   [provider]  butalbital-acetaminophen-caffeine (FIORICET, ESGIC) 50-325-40 MG per tablet Take 1 tablet by mouth 2 (two) times daily as needed for headache or migraine.     [provider]  cetirizine (ZYRTEC) 10 MG tablet Take 10 mg by mouth daily.    [provider]  desvenlafaxine (PRISTIQ) 100 MG 24 hr tablet Take 200 mg by mouth daily. 08/01/20   [provider]  Echinacea-Goldenseal (ECHINACEA COMB/GOLDEN SEAL PO) Take 1 tablet by mouth daily.    [provider]  fluticasone-salmeterol (ADVAIR) 100-50 MCG/ACT AEPB Inhale 1 puff into the lungs 2 (two) times daily. 08/16/18   [provider]  gabapentin (NEURONTIN) 300 MG capsule Take 300 mg by mouth at bedtime. 10/06/20   [provider]  Glycerin-Hypromellose-PEG 400 (DRY EYE RELIEF DROPS) 0.2-0.2-1 % SOLN Place 1 drop into both eyes daily as needed (dry eyes).    [provider]  hydrOXYzine (ATARAX/VISTARIL) 50 MG tablet Take 50-150 mg by mouth every 6 (six) hours as needed for anxiety or itching. 11/25/20   [provider]  ibuprofen (ADVIL) 600 MG tablet Take 1 tablet (600 mg total) by mouth every 6 (six) hours as needed. 06/09/21   Sloan Leiter, DO  Melatonin 10 MG TABS Take 10 mg by mouth at bedtime.    [provider]  oxyCODONE-acetaminophen (PERCOCET) 10-325 MG tablet Take 1 tablet by mouth 3 (three) times daily. 01/16/20   [provider]  SUMAtriptan (IMITREX) 100 MG tablet Take 100 mg by mouth every 2 (two) hours as needed for migraine. May repeat in 2 hours if headache persists or recurs.    [provider]  triamcinolone cream (KENALOG) 0.5 % Apply 1 application topically at bedtime as needed (dry skin on hands).    [provider]    Family History Family History  Problem Relation Age of Onset   Lung cancer Mother        Stage 4 Lung Cancer   Dementia Father    Dementia Paternal Aunt    Parkinson's disease Paternal Grandmother    Colon cancer Neg Hx    Esophageal cancer Neg Hx    Rectal cancer Neg Hx    Stomach cancer Neg Hx     Social History Social History   Tobacco Use   Smoking status: Former   Smokeless tobacco: Never  Substance Use Topics   Alcohol use: Yes    Comment: socially   Drug use: No     Allergies   Patient has no known allergies.   Review of Systems Review of Systems   Physical Exam Triage Vital Signs ED Triage Vitals [12/09/21 1943]  Enc Vitals Group     BP  125/85     Pulse Rate 90     Resp 18     Temp 98 F (36.7 C)     Temp Source Oral     SpO2 98 %     Weight      Height      Head Circumference      Peak Flow      Pain Score 0     Pain Loc      Pain Edu?      Excl. in GC?    No data found.  Updated Vital  Signs BP 125/85 (BP Location: Left Arm)   Pulse 90   Temp 98 F (36.7 C) (Oral)   Resp 18   SpO2 98%   Visual Acuity Right Eye Distance:   Left Eye Distance:   Bilateral Distance:    Right Eye Near:   Left Eye Near:    Bilateral Near:     Physical Exam Vitals reviewed.  Constitutional:      General: She is not in acute distress.    Appearance: She is not toxic-appearing.  HENT:     Nose: Nose normal.     Mouth/Throat:     Mouth: Mucous membranes are moist.     Pharynx: No oropharyngeal exudate or posterior oropharyngeal erythema.  Eyes:     Extraocular Movements: Extraocular movements intact.     Conjunctiva/sclera: Conjunctivae normal.     Pupils: Pupils are equal, round, and reactive to light.  Cardiovascular:     Rate and Rhythm: Normal rate and regular rhythm.     Heart sounds: No murmur heard. Pulmonary:     Effort: Pulmonary effort is normal. No respiratory distress.     Breath sounds: Normal breath sounds. No stridor. No wheezing, rhonchi or rales.  Musculoskeletal:     Cervical back: Neck supple.  Lymphadenopathy:     Cervical: No cervical adenopathy.  Skin:    Coloration: Skin is not jaundiced or pale.     Comments: Area of confluent erythema on her left posterior thigh and posterior knee.  It extends about 15 cm x 10 cm.  The skin is not really indurated.  There is a little soft tissue swelling.  No ulceration or vesicles  Neurological:     General: No focal deficit present.     Mental Status: She is alert and oriented to person, place, and time.  Psychiatric:        Behavior: Behavior normal.      UC Treatments / Results  Labs (all labs ordered are listed, but only abnormal results  are displayed) Labs Reviewed - No data to display  EKG   Radiology No results found.  Procedures Procedures (including critical care time)  Medications Ordered in UC Medications  triamcinolone acetonide (KENALOG-40) injection 40 mg (has no administration in time range)    Initial Impression / Assessment and Plan / UC Course  I have reviewed the triage vital signs and the nursing notes.  Pertinent labs & imaging results that were available during my care of the patient were reviewed by me and considered in my medical decision making (see chart for details).     Triamcinolone injection and prednisone for 5 days to treat reaction to the insect bite.  She will be seeing dermatology for other issues tomorrow and can follow-up with them Final Clinical Impressions(s) / UC Diagnoses   Final diagnoses:  Dermatitis     Discharge Instructions      You have been given a shot of triamcinolone 40 mg tonight  Take prednisone 20 mg--2 daily for 5 days     ED Prescriptions     Medication Sig Dispense Auth. Provider   predniSONE (DELTASONE) 20 MG tablet Take 2 tablets (40 mg total) by mouth daily with breakfast for 5 days. 10 tablet Marlinda Mike Janace Aris, MD      PDMP not reviewed this encounter.   Zenia Resides, MD 12/09/21 2003

## 2022-02-02 ENCOUNTER — Other Ambulatory Visit: Payer: Self-pay | Admitting: Student

## 2022-02-02 DIAGNOSIS — Z1231 Encounter for screening mammogram for malignant neoplasm of breast: Secondary | ICD-10-CM

## 2022-04-14 ENCOUNTER — Encounter: Payer: Self-pay | Admitting: Podiatry

## 2022-04-14 ENCOUNTER — Ambulatory Visit (INDEPENDENT_AMBULATORY_CARE_PROVIDER_SITE_OTHER): Payer: BC Managed Care – PPO

## 2022-04-14 ENCOUNTER — Ambulatory Visit: Payer: BC Managed Care – PPO | Admitting: Podiatry

## 2022-04-14 DIAGNOSIS — M7752 Other enthesopathy of left foot: Secondary | ICD-10-CM

## 2022-04-14 DIAGNOSIS — M779 Enthesopathy, unspecified: Secondary | ICD-10-CM

## 2022-04-14 DIAGNOSIS — M7751 Other enthesopathy of right foot: Secondary | ICD-10-CM | POA: Diagnosis not present

## 2022-04-14 MED ORDER — DICLOFENAC SODIUM 75 MG PO TBEC: 75.0000 mg | DELAYED_RELEASE_TABLET | Freq: Two times a day (BID) | ORAL | 2 refills | Status: AC

## 2022-04-14 MED ORDER — TRIAMCINOLONE ACETONIDE 10 MG/ML IJ SUSP
20.0000 mg | Freq: Once | INTRAMUSCULAR | Status: AC
  Administered 2022-04-14: 20 mg

## 2022-04-14 NOTE — Progress Notes (Signed)
Subjective:   Patient ID: Natalie Yang, female   DOB: 51 y.o.   MRN: [id number]   HPI Patient presents with a lot of forefoot pain both feet.  States that they are worse in the morning or after she gets up during the night and then at times she will have sharp pain.  Patient did have history of right knee severe infection last year.  Patient does not smoke likes to be active if possible   Review of Systems  All other systems reviewed and are negative.       Objective:  Physical Exam Vitals and nursing note reviewed.  Constitutional:      Appearance: She is well-developed.  Pulmonary:     Effort: Pulmonary effort is normal.  Musculoskeletal:        General: Normal range of motion.  Skin:    General: Skin is warm.  Neurological:     Mental Status: She is alert.     Neurovascular status was found to be intact muscle strength was found to be intact with patient having moderate inflammation around the lesser MPJ centered around the third MPJ bilateral.  Patient's right hurts more than the left has good digital perfusion well-oriented x3     Assessment:  Inflammatory capsulitis of the lesser MPJs bilateral     Plan:  H&P reviewed condition and discussed difficulty with the type of symptoms she is experiencing.  I went ahead today I did sterile prep I injected the periarticular joints 2 3 and four 3 mg Dexasone Kenalog 5 mg Xylocaine spreading around the joint bilateral advised on anti-inflammatories which were prescribed today rigid bottom shoes and reappoint as needed

## 2022-05-31 ENCOUNTER — Ambulatory Visit
Admission: RE | Admit: 2022-05-31 | Discharge: 2022-05-31 | Disposition: A | Discharge status: Home / Self Care | Payer: BC Managed Care – PPO | Source: Ambulatory Visit | Attending: Student | Admitting: Student

## 2022-05-31 DIAGNOSIS — Z1231 Encounter for screening mammogram for malignant neoplasm of breast: Secondary | ICD-10-CM

## 2023-01-17 IMAGING — MR MR LUMBAR SPINE W/O CM
5 of 6 series · 35 of 48 positions shown · non-contrast
Comparison: Lumbar spine radiographs 12/10/2019

CLINICAL DATA: Chronic low back pain with right leg pain and left
buttock pain.

EXAM:
MRI LUMBAR SPINE WITHOUT CONTRAST
TECHNIQUE: Multiplanar, multisequence MR imaging of the lumbar spine was
performed. No intravenous contrast was administered.

[Series 5: T1 · sagittal · 4.0mm · 0.81mm/px · 6 of 16 slices shown (1 of 3)]
[im 1/16]
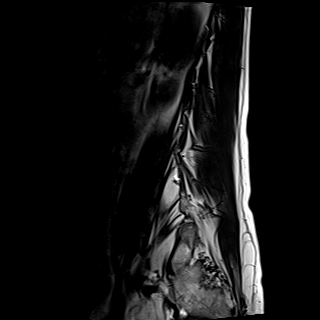
[im 4/16]
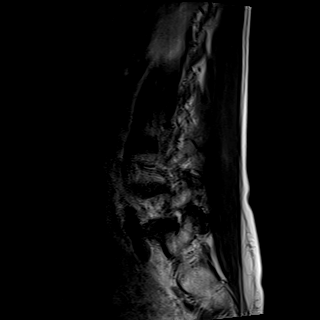
[im 7/16]
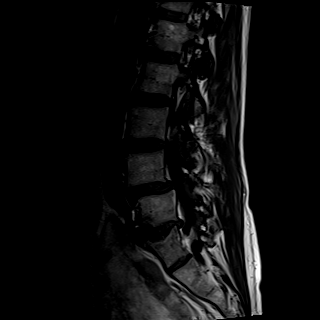
[im 10/16]
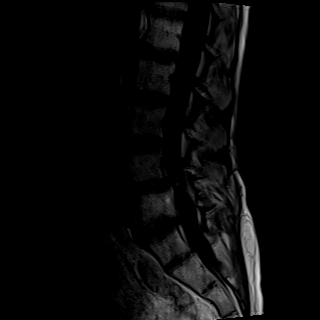
[im 13/16]
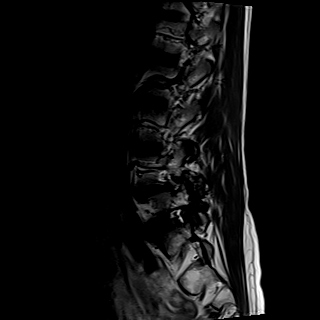
[im 16/16]
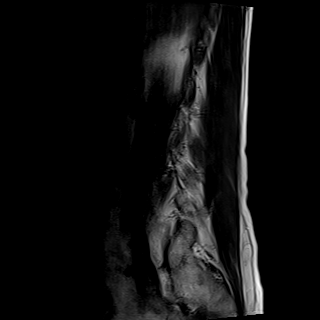

[Series 6: T2 · sagittal · 4.0mm · 0.81mm/px · 6 of 16 slices shown (1 of 2)]
[im 1/16]
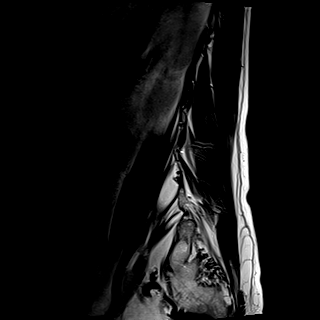
[im 4/16]
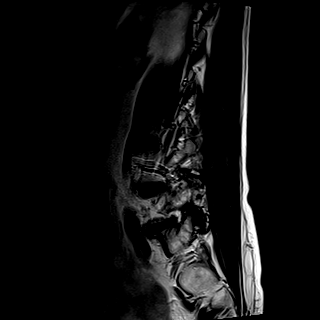
[im 7/16]
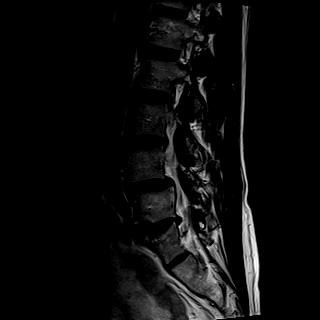
[im 10/16]
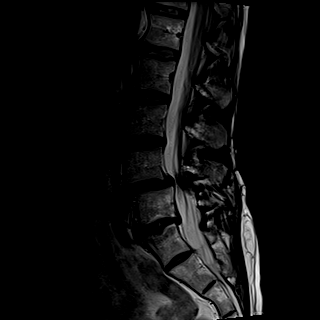
[im 13/16]
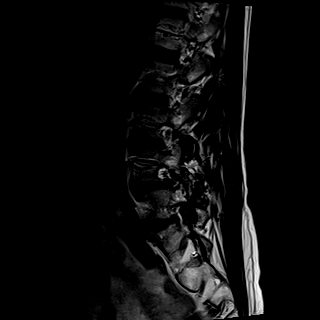
[im 16/16]
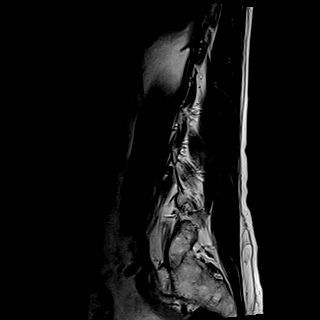

[Series 8: T2 · axial · 4.0mm · 0.78mm/px · z∈[-89,+117]mm · 9 of 39 slices shown (2 of 2)]
[im 1/39]
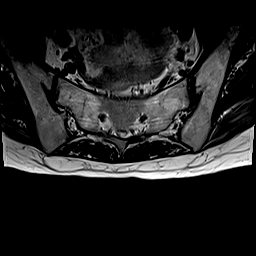
[im 7/39]
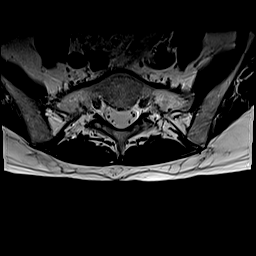
[im 13/39]
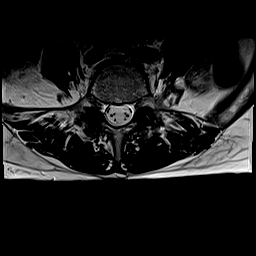
[im 16/39]
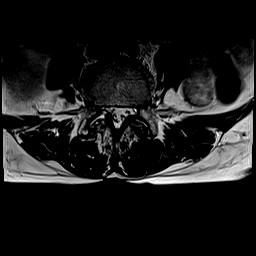
[im 20/39]
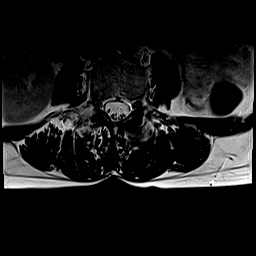
[im 23/39]
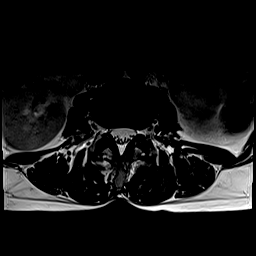
[im 26/39]
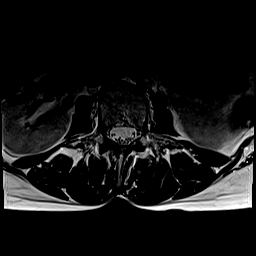
[im 32/39]
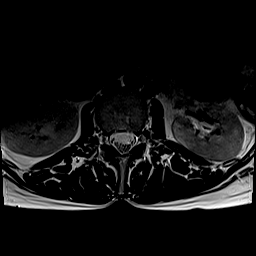
[im 39/39]
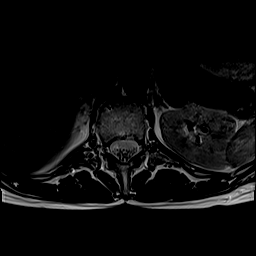

[Series 9: T1 · axial · 4.0mm · 0.39mm/px · z∈[-89,+117]mm · 9 of 39 slices shown (2 of 3)]
[im 1/39]
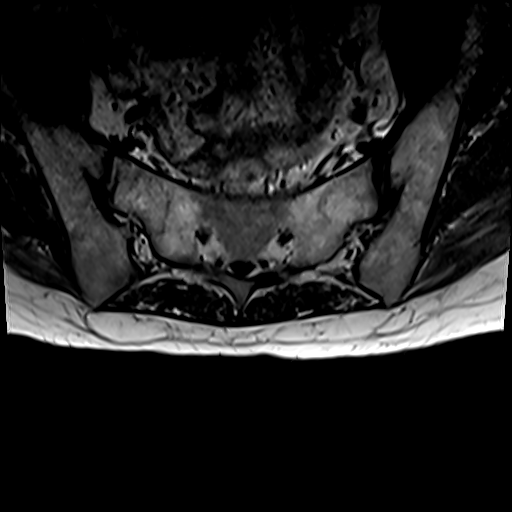
[im 7/39]
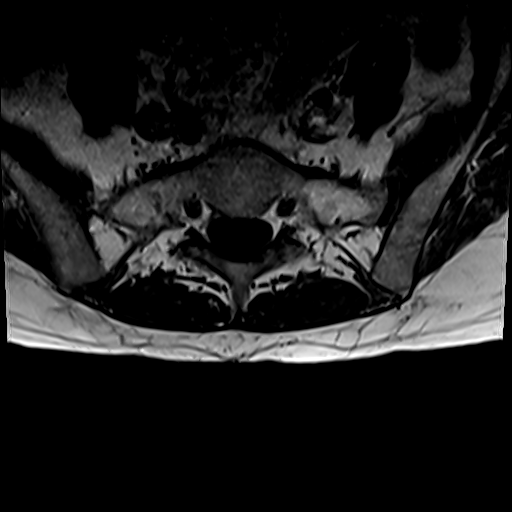
[im 13/39]
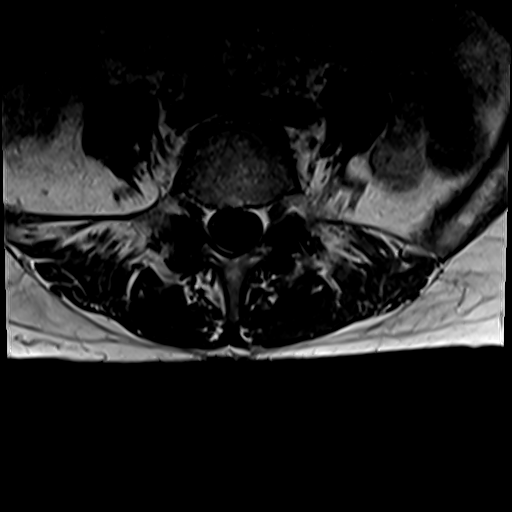
[im 16/39]
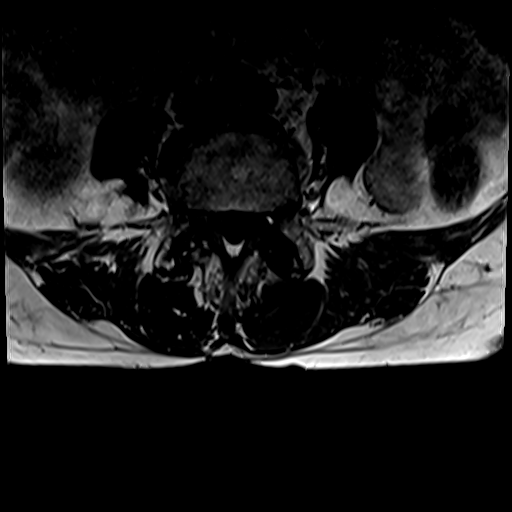
[im 20/39]
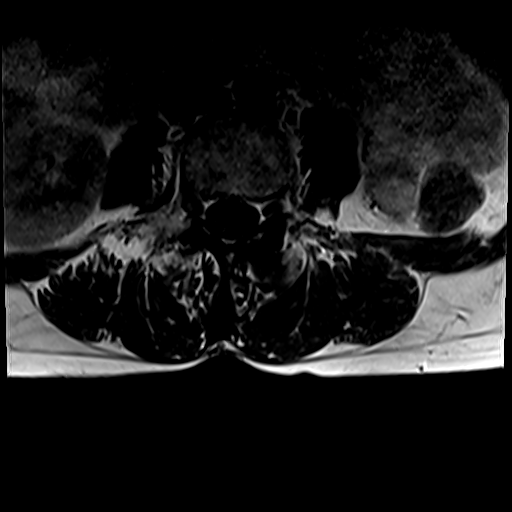
[im 23/39]
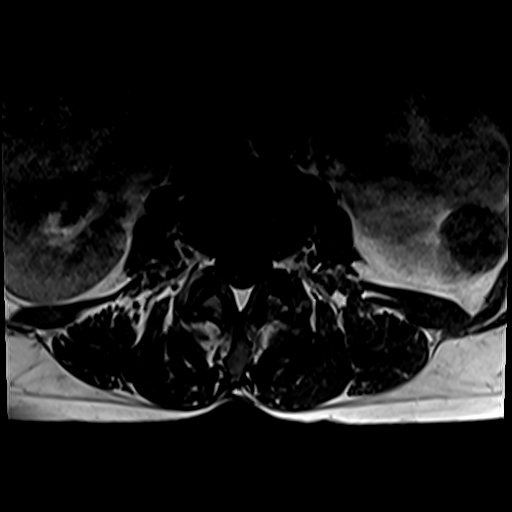
[im 26/39]
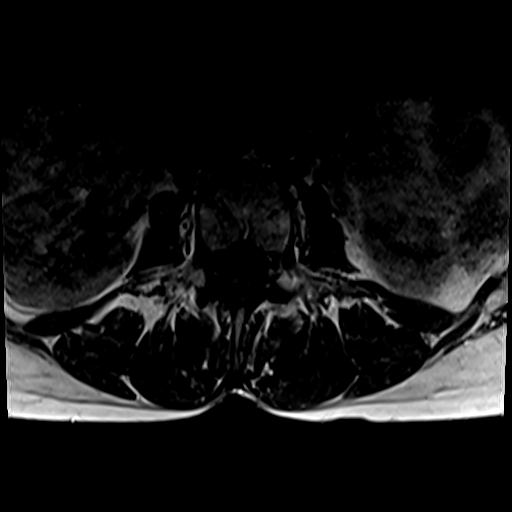
[im 32/39]
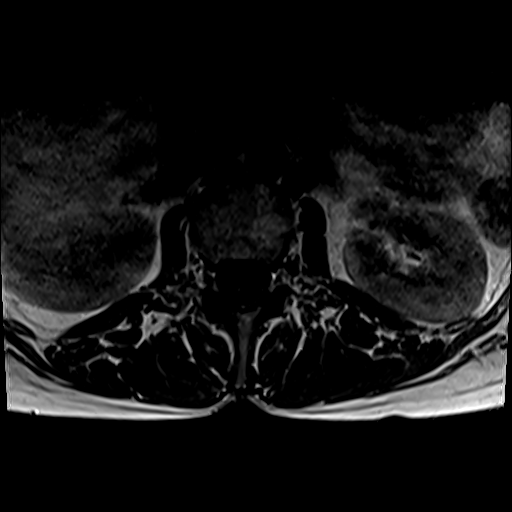
[im 39/39]
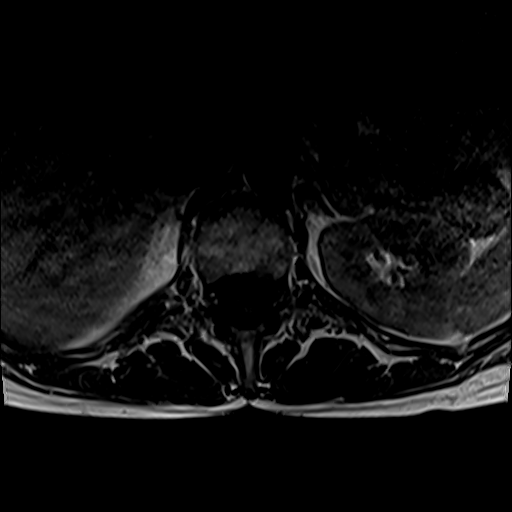

[Series 10: T1 · sagittal · 4.0mm · 0.81mm/px · 5 of 16 slices shown (3 of 3)]
[im 1/16]
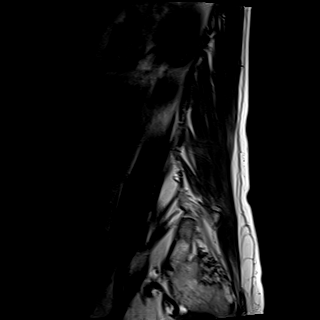
[im 4/16]
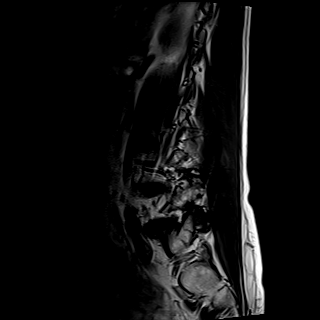
[im 8/16]
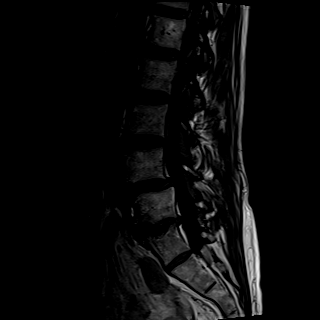
[im 12/16]
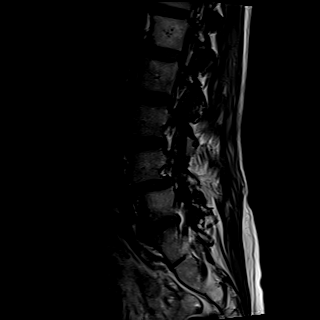
[im 16/16]
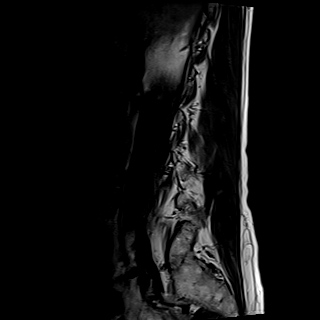

[35 of 48 positions shown; findings below may reference images not displayed]

FINDINGS: Segmentation:  Normal

Alignment:  6 mm anterolisthesis L4-5.  4 mm retrolisthesis L5-S1.

Vertebrae:  Negative for fracture or mass.

Conus medullaris and cauda equina: Conus extends to the L1-2 level.
Conus and cauda equina appear normal.

Paraspinal and other soft tissues: Negative for paraspinous mass or
adenopathy.

Disc levels:

L1-2: Negative

L2-3: Negative

L3-4: Mild disc and mild facet degeneration. Negative for disc
protrusion or stenosis.

L4-5: 6 mm anterolisthesis with severe facet degeneration
bilaterally. Severe subarticular stenosis on the right with right L5
nerve root impingement. Moderate right foraminal stenosis. Mild
subarticular stenosis on the left. Mild central canal stenosis.

L5-S1: Disc degeneration with mild endplate edema and diffuse
endplate spurring. Mild subarticular and foraminal stenosis
bilaterally.
IMPRESSION: 6 mm anterolisthesis L4-5. Severe subarticular stenosis on the right
with right L5 nerve root impingement. Moderate right foraminal
stenosis. Mild spinal stenosis

Mild subarticular and foraminal stenosis bilaterally L5-S1 due to
spurring.

## 2023-07-27 ENCOUNTER — Other Ambulatory Visit: Payer: Self-pay | Admitting: Physician Assistant

## 2023-07-27 DIAGNOSIS — Z0000 Encounter for general adult medical examination without abnormal findings: Secondary | ICD-10-CM

## 2023-08-12 ENCOUNTER — Ambulatory Visit: Payer: BC Managed Care – PPO

## 2023-08-22 ENCOUNTER — Ambulatory Visit: Payer: BC Managed Care – PPO

## 2023-08-30 ENCOUNTER — Ambulatory Visit
Admission: RE | Admit: 2023-08-30 | Discharge: 2023-08-30 | Disposition: A | Discharge status: Home / Self Care | Source: Ambulatory Visit | Attending: Physician Assistant | Admitting: Physician Assistant

## 2023-08-30 DIAGNOSIS — Z0000 Encounter for general adult medical examination without abnormal findings: Secondary | ICD-10-CM

## 2023-08-31 ENCOUNTER — Other Ambulatory Visit: Payer: Self-pay | Admitting: Physician Assistant

## 2023-08-31 DIAGNOSIS — N6321 Unspecified lump in the left breast, upper outer quadrant: Secondary | ICD-10-CM

## 2023-08-31 DIAGNOSIS — N644 Mastodynia: Secondary | ICD-10-CM

## 2023-09-29 ENCOUNTER — Other Ambulatory Visit

## 2023-09-29 ENCOUNTER — Encounter

## 2023-10-25 ENCOUNTER — Other Ambulatory Visit

## 2023-10-25 ENCOUNTER — Encounter

## 2023-11-23 NOTE — Progress Notes (Signed)
 Natalie Yang  MRN: [id number] DOB: 1971/03/04  PCP: Lauraine Patient, PA   Assessment and Plan :   1. Annual physical exam      2. Current moderate episode of major depressive disorder without prior episode (*)  desvenlafaxine  (PRISTIQ ) 50 mg 24 hr tablet   lurasidone HCl (LATUDA) 40 mg TABS tablet    3. GAD (generalized anxiety disorder)  desvenlafaxine  (PRISTIQ ) 100 MG 24 hr tablet    4. Attention deficit hyperactivity disorder (ADHD), combined type  lisdexamfetamine (VYVANSE) 60 mg capsule   lisdexamfetamine (VYVANSE) 60 mg capsule   lisdexamfetamine (VYVANSE) 60 mg capsule    5. Migraine without status migrainosus, not intractable, unspecified migraine type  SUMAtriptan  succinate (IMITREX ) 100 mg tablet    6. Pruritus  pimozide (ORAP) 2 MG tablet   famotidine (PEPCID) 20 mg tablet    7. Herpes simplex infection of perianal skin  acyclovir  (ZOVIRAX ) 200 MG capsule    8. Encounter for screening mammogram for malignant neoplasm of breast  Mammo 3D Tomo Screening Bilateral      Assessment & Plan 1. Shoulder pain. - Reports difficulty lifting her arm and performing daily activities due to shoulder pain. - MRI completed; scheduled for surgery to remove mesh. - Physical therapy required post-surgery. - Anticipates surgery before the end of the year.  2. Health maintenance. - Last Pap smear conducted in 2020; due for another one. - Most recent mammogram performed in 05/2022 at the [facility]. - Colonoscopy performed in 2023; on a 10-year recall. - Thyroid function within normal limits; Vitamin B12 levels slightly decreased compared to 2 years ago, likely due to discontinuation of supplementation; Vitamin D levels satisfactory. - LDL cholesterol elevated, higher than 2 years ago, possibly due to carnivore diet; metabolic panel reveals mild liver elevation, but improved from previous results; urinalysis shows a small amount of blood, which will be monitored; complete blood count  within normal limits, although red blood cells are slightly darker and larger than normal, typically associated with vitamin B12 deficiency, which she does not have. - Lipoprotein A test will be added to labs to assess genetic risk factors; mammogram will be scheduled at [facility]; Pap smear will be scheduled in 6 months.  3. Urinary incontinence. - Reports occasional urinary incontinence when jumping, laughing, or sneezing hard. - Monitoring symptoms.  4. Headaches. - Continues to experience headaches; requests refill of sumatriptan  prescription, which she uses 2 to 3 times per week. - Advised against using sumatriptan  more than 4 to 5 times per month due to risk of rebound headaches. - Potential use of Qulipta discussed.  5. Itching. - Noticed increased itching since discontinuing pimozide. - Famotidine prescribed for itching.  6. ADHD. - Symptoms well-managed with Vyvanse 60 mg. - Refill prescribed.  7. Mental health. - Mental health is good; continues with Pristiq  150 mg and Latuda 40 mg. - Refill prescribed.  Follow up in about 3 months (around 02/23/2024) for mental health, ADD.  Risks, benefits, and alternatives of the medications and treatment plan prescribed today were discussed, and patient expressed understanding. Answered all questions and addressed all concerns to the patient's satisfaction.  Plan follow-up as discussed or as needed if any worsening symptoms or change in condition.  Patient voiced understanding of the treatment plan and agreed to attempt to comply.  Designer, fashion/clothing may have been used to create parts of the visit note. Consent from the patient/caregiver was obtained prior to its use.  See after visit summary for patient  specific instructions.    Patient's Medications       * Accurate as of November 23, 2023 11:59 PM. Reflects encounter med changes as of last refresh          Continued Medications      Instructions  acyclovir  200  MG capsule Commonly known as: ZOVIRAX   200 mg, Oral, 2 times a day, 0   ALLERGY RELIEF 10 MG tablet Generic drug: loratadine   10 mg, Daily   bimatoprost 0.03% ophthalmic solution Commonly known as: LATISSE  1 drop   butalbital -acetaminophen -caffeine  50-325-40 mg per tablet Commonly known as: ESGIC   1 tablet, Oral, Every 6 hours as needed   cetirizine 10 mg tablet Commonly known as: ZYRTEC  10 mg, Oral, Daily   clindamycin 1% gel Commonly known as: CLEOCIN-T  SMARTSIG:Topical Twice Daily   * desvenlafaxine  50 mg 24 hr tablet Commonly known as: PRISTIQ   50 mg, Oral, Daily, Add to 100mg  for a total daily dose of 150mg    * desvenlafaxine  100 MG 24 hr tablet Commonly known as: PRISTIQ   100 mg, Oral, Daily   diclofenac  sodium 75 mg EC tablet Commonly known as: VOLTAREN   75 mg, 2 times a day   famotidine 20 mg tablet Commonly known as: PEPCID  20 mg, Oral, 2 times a day   fluticasone-salmeterol 250-50 mcg/dose Aepb inhalation powder Commonly known as: ADVAIR DISKUS/WIXELA  1 puff, Inhalation, 2 times a day   ketoconazole 2% shampoo Commonly known as: NIZORAL  APPLY TOPICALLY 3 TIMES A WEEK   lurasidone HCl 40 mg Tabs tablet Commonly known as: LATUDA  40 mg, Oral, Daily   oxyCODONE -acetaminophen  10-325 mg per tablet Commonly known as: PERCOCET,ENDOCET  1 tablet, 3 times a day as needed   pimozide 2 MG tablet Commonly known as: ORAP  2 mg, Oral, Daily   SUMAtriptan  succinate 100 mg tablet Commonly known as: IMITREX   TAKE 1 TABLET BY MOUTH EVERY 2 HOURS AS NEEDED FOR MIGRAINE. MAX 2 IN 24 HOURS   topiramate 50 MG tablet Commonly known as: TOPAMAX  50 mg, Daily   triamcinolone  acetonide 0.1% cream Commonly known as: KENALOG   1 Application Topical 2-3 Times Daily   VENTOLIN  HFA 108 (90 Base) MCG/ACT inhaler Generic drug: albuterol  sulfate HFA  2 puffs, Inhalation, Every 6 hours as needed respiratory      * * This list has 2 medication(s) that are the  same as other medications prescribed for you. Read the directions carefully, and ask your doctor or other care provider to review them with you.          Modified Medications      Instructions  benztropine 1 mg tablet Commonly known as: COGENTIN What changed: Another medication with the same name was removed. Continue taking this medication, and follow the directions you see here. Changed by: Lauraine Patient, PA  1 mg, Every morning   * lisdexamfetamine 60 mg capsule Commonly known as: VYVANSE What changed: Another medication with the same name was changed. Make sure you understand how and when to take each. Changed by: Lauraine Patient, PA  60 mg, Oral, Every morning   * lisdexamfetamine 60 mg capsule Commonly known as: VYVANSE Start taking on: December 23, 2023 What changed: These instructions start on December 23, 2023. If you are unsure what to do until then, ask your doctor or other care provider. Changed by: Lauraine Patient, PA  60 mg, Oral, Every morning   * lisdexamfetamine 60 mg capsule Commonly known as: VYVANSE  Start taking on: January 23, 2024 What changed: These instructions start on January 23, 2024. If you are unsure what to do until then, ask your doctor or other care provider. Changed by: Lauraine Patient, PA  60 mg, Oral, Every morning      * * This list has 3 medication(s) that are the same as other medications prescribed for you. Read the directions carefully, and ask your doctor or other care provider to review them with you.           Subjective:   Chief Complaint  Patient presents with  . Annual Exam    Pt presents to clinic for a CPE.  Cervical Cancer Screening:       Last pap -  2020 - wants to have at next appt      History of previous abnormal pap - never Breast Cancer Screening:       Last mammogram 12/23 - breast center      SBE - no changes Colorectal Cancer Screening: 2023 - 10 yr recall   Dental exam: yearly Last vision exam: wears glasses -  yearly  Typical meals for patient: 3 meals - more home cooked Typical beverage choices: water  Exercises: 15K steps Sleeps: 8 hrs per night and sleeping well   Takes Vit D supplement  ADHD - meds are working well  Mental health - stable on meds   History of Present Illness The patient presents for a routine checkup.  She has been diagnosed with a shoulder condition that requires surgical intervention. However, the procedure has been postponed due to her mother's recent fall. She experiences difficulty in lifting her arm, which has resulted in a cooking-related burn. She is unable to perform certain tasks such as putting on her bra independently but can touch her back with her arm.  She has not had an appointment with Dr. Rheta office for gynecology in a long time. She has never had an abnormal Pap smear. She reports no lumps, bumps, or changes in her breasts. Her last Pap smear was in 2020, and her last mammogram was in 05/2022. She had a colonoscopy in 2023. She goes to the dentist yearly. She had her eyes checked this year and goes yearly. She eats three meals a day, mostly home-cooked, but sometimes even eats more than three. She drinks water  during the day and diet ginger ale in the morning as it settles her stomach. She probably gets about 15,000 steps in a day. She sleeps about seven hours a night. She is currently employed at [employer].  She reports no urinary incontinence, except when jumping or sneezing.  She continues to experience headaches and requests a refill of her sumatriptan  prescription, which she uses two to three times per week.  She has been off pimozide and has noticed increased itching since discontinuing the medication.  Her ADHD symptoms are well-managed with Vyvanse.  Her mental health is good.  PAST SURGICAL HISTORY: - Gallbladder surgery - Knee replacements (mother) -   SOCIAL HISTORY She does not smoke or vape. She drinks alcohol  occasionally in  social settings. She does not use marijuana.  FAMILY HISTORY Her mother has had two knee replacements and experienced a fall in the shower. Her father is still at home and being cared for by her mother.   Patient Active Problem List  Diagnosis  . Migraine  . Menopausal symptom  . GAD (generalized anxiety disorder)  . Current moderate episode of major depressive disorder without prior  episode (*)  . Herpes simplex infection of perianal skin  . Attention deficit hyperactivity disorder (ADHD), combined type  . Spondylolisthesis of lumbar region  . Degenerative spondylolisthesis  . Sleep disturbance  . Full thickness rotator cuff tear  . Female stress incontinence  . Stress due to illness of family member  . Chronic pain syndrome  . Anterior to posterior tear of superior glenoid labrum of left shoulder  . Osteoarthritis of left acromioclavicular joint    History was obtained from patient.  Vaccinations: Immunization History  Administered Date(s) Administered  . Hepatitis A Adult 08/12/2017, 09/25/2021  . Hepatitis A Unspecified Formulation 08/12/2017  . Hepatitis B Adult 08/12/2017  . Influenza Flulaval Quad 0.5ml 03/21/2017  . Influenza, injectable, MDCK, preservative free, quadrivalent(FlucelvaxPF) 06/22/2021  . Tdap 03/02/2019, 06/09/2021  . Zoster Recombinant (Shingrix) 06/22/2021, 09/25/2021    Patient Care Team: Lauraine Patient, PA as PCP - General (Family Medicine) Donaciano JONETTA Sprang, MD as Consulting Physician (Orthopedic Surgery) Thora Nian, NP  Review of Systems  Constitutional: Negative.  Negative for appetite change and unexpected weight change.  HENT: Negative.    Eyes: Negative.   Respiratory: Negative.    Cardiovascular: Negative.  Negative for chest pain and palpitations.  Gastrointestinal: Negative.   Endocrine: Negative.   Genitourinary: Negative.   Musculoskeletal:  Positive for arthralgias.       Has chronic pain - sees a specialist - still  working with ortho for her shoulder  Skin: Negative.   Allergic/Immunologic: Negative.   Neurological: Negative.   Hematological: Negative.   Psychiatric/Behavioral:  Positive for dysphoric mood (stable). Negative for decreased concentration (controlled on medications) and sleep disturbance. The patient is nervous/anxious (stable).        Patient's Medications       * Accurate as of November 23, 2023 11:59 PM. Reflects encounter med changes as of last refresh          Continued Medications      Instructions  acyclovir  200 MG capsule Commonly known as: ZOVIRAX   200 mg, Oral, 2 times a day, 0   ALLERGY RELIEF 10 MG tablet Generic drug: loratadine   10 mg, Daily   bimatoprost 0.03% ophthalmic solution Commonly known as: LATISSE  1 drop   butalbital -acetaminophen -caffeine  50-325-40 mg per tablet Commonly known as: ESGIC   1 tablet, Oral, Every 6 hours as needed   cetirizine 10 mg tablet Commonly known as: ZYRTEC  10 mg, Oral, Daily   clindamycin 1% gel Commonly known as: CLEOCIN-T  SMARTSIG:Topical Twice Daily   * desvenlafaxine  50 mg 24 hr tablet Commonly known as: PRISTIQ   50 mg, Oral, Daily, Add to 100mg  for a total daily dose of 150mg    * desvenlafaxine  100 MG 24 hr tablet Commonly known as: PRISTIQ   100 mg, Oral, Daily   diclofenac  sodium 75 mg EC tablet Commonly known as: VOLTAREN   75 mg, 2 times a day   famotidine 20 mg tablet Commonly known as: PEPCID  20 mg, Oral, 2 times a day   fluticasone-salmeterol 250-50 mcg/dose Aepb inhalation powder Commonly known as: ADVAIR DISKUS/WIXELA  1 puff, Inhalation, 2 times a day   ketoconazole 2% shampoo Commonly known as: NIZORAL  APPLY TOPICALLY 3 TIMES A WEEK   lurasidone HCl 40 mg Tabs tablet Commonly known as: LATUDA  40 mg, Oral, Daily   oxyCODONE -acetaminophen  10-325 mg per tablet Commonly known as: PERCOCET,ENDOCET  1 tablet, 3 times a day as needed   pimozide 2 MG tablet Commonly known as:  ORAP  2 mg, Oral, Daily   SUMAtriptan  succinate 100 mg tablet Commonly known as: IMITREX   TAKE 1 TABLET BY MOUTH EVERY 2 HOURS AS NEEDED FOR MIGRAINE. MAX 2 IN 24 HOURS   topiramate 50 MG tablet Commonly known as: TOPAMAX  50 mg, Daily   triamcinolone  acetonide 0.1% cream Commonly known as: KENALOG   1 Application Topical 2-3 Times Daily   VENTOLIN  HFA 108 (90 Base) MCG/ACT inhaler Generic drug: albuterol  sulfate HFA  2 puffs, Inhalation, Every 6 hours as needed respiratory      * * This list has 2 medication(s) that are the same as other medications prescribed for you. Read the directions carefully, and ask your doctor or other care provider to review them with you.          Modified Medications      Instructions  benztropine 1 mg tablet Commonly known as: COGENTIN What changed: Another medication with the same name was removed. Continue taking this medication, and follow the directions you see here. Changed by: Lauraine Patient, PA  1 mg, Every morning   * lisdexamfetamine 60 mg capsule Commonly known as: VYVANSE What changed: Another medication with the same name was changed. Make sure you understand how and when to take each. Changed by: Lauraine Patient, PA  60 mg, Oral, Every morning   * lisdexamfetamine 60 mg capsule Commonly known as: VYVANSE Start taking on: December 23, 2023 What changed: These instructions start on December 23, 2023. If you are unsure what to do until then, ask your doctor or other care provider. Changed by: Lauraine Patient, PA  60 mg, Oral, Every morning   * lisdexamfetamine 60 mg capsule Commonly known as: VYVANSE Start taking on: January 23, 2024 What changed: These instructions start on January 23, 2024. If you are unsure what to do until then, ask your doctor or other care provider. Changed by: Lauraine Patient, PA  60 mg, Oral, Every morning      * * This list has 3 medication(s) that are the same as other medications prescribed for you. Read the  directions carefully, and ask your doctor or other care provider to review them with you.            No Known Allergies    Past Surgical History:  Procedure Laterality Date  . Cholecystectomy    . Knee surgery Right 11/18/2020    Family History  Problem Relation Age of Onset  . Cancer Mother        Stage 4 Lung Cancer  . Dementia Father   . No Known Problems Daughter   . No Known Problems Son     Social Drivers of Health   Tobacco Use: Medium Risk (11/23/2023)   Patient History   . Smoking Tobacco Use: Former   . Smokeless Tobacco Use: Never   . Passive Exposure: Never  Alcohol  Use: Not At Risk (02/18/2023)   AUDIT-C   . Frequency of Alcohol  Consumption: 2-4 times a month   . Average Number of Drinks: 1 or 2   . Frequency of Binge Drinking: Never  Financial Resource Strain: Low Risk  (11/23/2023)   Overall Financial Resource Strain (CARDIA)   . Difficulty of Paying Living Expenses: Not hard at all  Food Insecurity: No Food Insecurity (11/23/2023)   Hunger Vital Sign   . Worried About Programme researcher, broadcasting/film/video in the Last Year: Never true   . Ran Out of Food in the Last Year: Never true  Transportation Needs: Unmet  Transportation Needs (11/23/2023)   PRAPARE - Transportation   . Lack of Transportation (Medical): Yes   . Lack of Transportation (Non-Medical): Yes  Physical Activity: Insufficiently Active (02/18/2023)   Exercise Vital Sign   . Days of Exercise per Week: 3 days   . Minutes of Exercise per Session: 30 min  Stress: No Stress Concern Present (02/18/2023)   [employer] of Occupational Health - Occupational Stress Questionnaire   . Feeling of Stress : Only a little  Social Connections: Socially Integrated (02/18/2023)   Social Network   . Social Network: Good participation with social networks  Intimate Partner Violence: Not At Risk (02/18/2023)   HITS   . Physically Hurt: Never   . Insult or Talk Down To: Never   . Threaten Physical Harm: Never   . Scream or  Curse: Never  Depression: At risk (06/22/2021)   Depression   . Depression Screening: 3  Housing Stability: High Risk (11/23/2023)   Housing Stability Vital Sign   . Unable to Pay for Housing in the Last Year: Yes   . Number of Times Moved in the Last Year: 0   . Homeless in the Last Year: Yes  Utilities: Not At Risk (11/23/2023)   [facility] Utilities   . Threatened with loss of utilities: No     Objective:  BP 112/68 (BP Location: Right Upper Arm, Patient Position: Sitting)   Pulse 90   Temp 97.7 F (36.5 C) (Temporal)   Resp 17   Ht 5' 7 (1.702 m)   Wt 131 lb (59.4 kg)   SpO2 99%   BMI 20.52 kg/m   Wt Readings from Last 3 Encounters:  11/23/23 131 lb (59.4 kg)  08/22/23 147 lb (66.7 kg)  06/16/23 154 lb 12.8 oz (70.2 kg)    No results found.  Physical Exam Vitals and nursing note reviewed. Exam conducted with a chaperone present.  Constitutional:      Appearance: Normal appearance.  HENT:     Head: Normocephalic and atraumatic.     Right Ear: Hearing, tympanic membrane, ear canal and external ear normal. There is no impacted cerumen.     Left Ear: Hearing, tympanic membrane, ear canal and external ear normal. There is no impacted cerumen.     Nose: Nose normal. No congestion or rhinorrhea.     Mouth/Throat:     Mouth: Mucous membranes are moist.     Pharynx: Oropharynx is clear. No oropharyngeal exudate or posterior oropharyngeal erythema.  Eyes:     Extraocular Movements: Extraocular movements intact.     Conjunctiva/sclera: Conjunctivae normal.     Pupils: Pupils are equal, round, and reactive to light.  Neck:     Thyroid: No thyroid mass or thyromegaly.     Trachea: Trachea and phonation normal.  Cardiovascular:     Rate and Rhythm: Normal rate and regular rhythm.     Heart sounds: Normal heart sounds. No murmur heard. Pulmonary:     Effort: Pulmonary effort is normal.     Breath sounds: Normal breath sounds.  Chest:     Comments: Deferred exam until pap smear  in 6 months Abdominal:     General: Abdomen is flat. Bowel sounds are normal.     Palpations: Abdomen is soft.  Genitourinary:    Comments: Deferred exam and pap today - will have in 6 months Musculoskeletal:        General: Normal range of motion.     Cervical back: Normal range of motion  and neck supple.  Lymphadenopathy:     Cervical:     Right cervical: No superficial or posterior cervical adenopathy.    Left cervical: No superficial or posterior cervical adenopathy.  Skin:    General: Skin is warm and dry.     Capillary Refill: Capillary refill takes less than 2 seconds.  Neurological:     General: No focal deficit present.     Mental Status: She is alert and oriented to person, place, and time.     Sensory: Sensation is intact.     Motor: Motor function is intact.     Gait: Gait normal.     Deep Tendon Reflexes: Reflexes are normal and symmetric.  Psychiatric:        Mood and Affect: Mood normal.        Behavior: Behavior normal.        Thought Content: Thought content normal.        Judgment: Judgment normal.        Lauraine Allen DEVONNA [facility]  11/23/2023       *Some images could not be shown.

## 2023-12-20 ENCOUNTER — Other Ambulatory Visit: Payer: Self-pay

## 2023-12-20 ENCOUNTER — Emergency Department (HOSPITAL_COMMUNITY)

## 2023-12-20 ENCOUNTER — Encounter (HOSPITAL_COMMUNITY): Payer: Self-pay | Admitting: Emergency Medicine

## 2023-12-20 ENCOUNTER — Emergency Department (HOSPITAL_COMMUNITY)
Admission: EM | Admit: 2023-12-20 | Discharge: 2023-12-20 | Discharge status: Against Medical Advice | Attending: Emergency Medicine | Admitting: Emergency Medicine

## 2023-12-20 DIAGNOSIS — R42 Dizziness and giddiness: Secondary | ICD-10-CM | POA: Diagnosis present

## 2023-12-20 DIAGNOSIS — Z5329 Procedure and treatment not carried out because of patient's decision for other reasons: Secondary | ICD-10-CM | POA: Insufficient documentation

## 2023-12-20 DIAGNOSIS — J45909 Unspecified asthma, uncomplicated: Secondary | ICD-10-CM | POA: Insufficient documentation

## 2023-12-20 DIAGNOSIS — R269 Unspecified abnormalities of gait and mobility: Secondary | ICD-10-CM

## 2023-12-20 DIAGNOSIS — R531 Weakness: Secondary | ICD-10-CM

## 2023-12-20 DIAGNOSIS — R4781 Slurred speech: Secondary | ICD-10-CM

## 2023-12-20 DIAGNOSIS — R471 Dysarthria and anarthria: Secondary | ICD-10-CM | POA: Diagnosis not present

## 2023-12-20 LAB — COMPREHENSIVE METABOLIC PANEL WITH GFR
ALT: 23 U/L (ref 0–44)
AST: 21 U/L (ref 15–41)
Albumin: 3.6 g/dL (ref 3.5–5.0)
Alkaline Phosphatase: 76 U/L (ref 38–126)
Anion gap: 11 (ref 5–15)
BUN: 16 mg/dL (ref 6–20)
CO2: 20 mmol/L — ABNORMAL LOW (ref 22–32)
Calcium: 9.1 mg/dL (ref 8.9–10.3)
Chloride: 108 mmol/L (ref 98–111)
Creatinine, Ser: 0.57 mg/dL (ref 0.44–1.00)
GFR, Estimated: >60 mL/min (ref >60)
Glucose, Bld: 84 mg/dL (ref 70–99)
Potassium: 4 mmol/L (ref 3.5–5.1)
Sodium: 139 mmol/L (ref 135–145)
Total Bilirubin: 0.4 mg/dL (ref 0.0–1.2)
Total Protein: 6.8 g/dL (ref 6.5–8.1)

## 2023-12-20 LAB — DIFFERENTIAL
Abs Immature Granulocytes: 0.03 K/uL (ref 0.00–0.07)
Basophils Absolute: 0.1 K/uL (ref 0.0–0.1)
Basophils Relative: 2 %
Eosinophils Absolute: 0.1 K/uL (ref 0.0–0.5)
Eosinophils Relative: 2 %
Immature Granulocytes: 1 %
Lymphocytes Relative: 34 %
Lymphs Abs: 2.2 K/uL (ref 0.7–4.0)
Monocytes Absolute: 0.6 K/uL (ref 0.1–1.0)
Monocytes Relative: 9 %
Neutro Abs: 3.6 K/uL (ref 1.7–7.7)
Neutrophils Relative %: 52 %

## 2023-12-20 LAB — CBC
HCT: 41.9 % (ref 36.0–46.0)
Hemoglobin: 13.9 g/dL (ref 12.0–15.0)
MCH: 34.5 pg — ABNORMAL HIGH (ref 26.0–34.0)
MCHC: 33.2 g/dL (ref 30.0–36.0)
MCV: 104 fL — ABNORMAL HIGH (ref 80.0–100.0)
Platelets: 302 K/uL (ref 150–400)
RBC: 4.03 MIL/uL (ref 3.87–5.11)
RDW: 12.8 % (ref 11.5–15.5)
WBC: 6.7 K/uL (ref 4.0–10.5)
nRBC: 0 % (ref 0.0–0.2)

## 2023-12-20 LAB — VITAMIN B12: Vitamin B-12: 353 pg/mL (ref 180–914)

## 2023-12-20 LAB — ETHANOL: Alcohol, Ethyl (B): <15 mg/dL (ref <15)

## 2023-12-20 LAB — HEMOGLOBIN A1C
Hgb A1c MFr Bld: 5.2 % (ref 4.8–5.6)
Mean Plasma Glucose: 102.54 mg/dL

## 2023-12-20 LAB — PHOSPHORUS: Phosphorus: 4 mg/dL (ref 2.5–4.6)

## 2023-12-20 LAB — HIV ANTIBODY (ROUTINE TESTING W REFLEX): HIV Screen 4th Generation wRfx: NONREACTIVE

## 2023-12-20 LAB — I-STAT CHEM 8, ED
BUN: 14 mg/dL (ref 6–20)
Calcium, Ion: 1.19 mmol/L (ref 1.15–1.40)
Chloride: 107 mmol/L (ref 98–111)
Creatinine, Ser: 0.7 mg/dL (ref 0.44–1.00)
Glucose, Bld: 83 mg/dL (ref 70–99)
HCT: 44 % (ref 36.0–46.0)
Hemoglobin: 15 g/dL (ref 12.0–15.0)
Potassium: 4 mmol/L (ref 3.5–5.1)
Sodium: 138 mmol/L (ref 135–145)
TCO2: 20 mmol/L — ABNORMAL LOW (ref 22–32)

## 2023-12-20 LAB — PROTIME-INR
INR: 1 (ref 0.8–1.2)
Prothrombin Time: 13.8 s (ref 11.4–15.2)

## 2023-12-20 LAB — APTT: aPTT: 30 s (ref 24–36)

## 2023-12-20 LAB — MAGNESIUM: Magnesium: 2.2 mg/dL (ref 1.7–2.4)

## 2023-12-20 LAB — FOLATE: Folate: 11.1 ng/mL (ref >5.9)

## 2023-12-20 LAB — TSH: TSH: 1.351 u[IU]/mL (ref 0.350–4.500)

## 2023-12-20 MED ORDER — SODIUM CHLORIDE 0.9% FLUSH: 3.0000 mL | Freq: Once | INTRAVENOUS | Status: DC

## 2023-12-20 NOTE — ED Notes (Signed)
 Pt would like to leave stating that I did not have a stroke, provider made aware.

## 2023-12-20 NOTE — Consult Note (Addendum)
 NEUROLOGY CONSULT NOTE   Date of service: December 20, 2023 Patient Name: Natalie Yang MRN:  [id number] DOB:  June 05, 1971 Chief Complaint: Code Stroke Requesting Provider: Lenor Hollering, MD  History of Present Illness  Natalie Yang is a 53 y.o. female with hx of migraines, shoulder pain, asthma, GERD, and arthritis presenting with concern for possible slight right sided facial droop, abnormal gait, dizziness, and dysarthria. She was at the church thrift shopping and then spoke with her husband who stated that her speech was normal.. Members of the church called EMS when they noted her slurred speech and dizziness. Per EMS report, she was slightly combative and uncooperative on their initial arrival (per husband this is typical for her as she does not like to go to the hospital for evaluation when she has these episodes). BP 131/85, glucose 83. She reports a headache over the last couple of days and did she states that she did take her sumatriptan  this morning. Husband reports episodes of multiple days of not eating with similar presentation in the past. She states that she did eat breakfast this morning prior to going shopping.  She was walking down one of the [place] when she became dizzy and workers tried to get her to sit down but she did not want to.  She continuously stated that she was fine and they called EMS.  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  In speaking with me and the ED provider she does become tearful.  LKW: 0930 Modified rankin score: 0-Completely asymptomatic and back to baseline post- stroke IV Thrombolysis: No, stroke not suspected EVT: No, no LVO  NIHSS components Score: Comment  1a Level of Conscious 0[x]  1[]  2[]  3[]      1b LOC Questions 0[x]  1[]  2[]       1c LOC Commands 0[x]  1[]  2[]       2 Best Gaze 0[x]  1[]  2[]       3 Visual 0[x]  1[]  2[]  3[]      4 Facial Palsy 0[x]  1[]  2[]  3[]      5a Motor Arm  - left 0[x]  1[]  2[]  3[]  4[]  UN[]    5b Motor Arm - Right 0[x]  1[]  2[]  3[]  4[]  UN[]    6a Motor Leg - Left 0[x]  1[]  2[]  3[]  4[]  UN[]    6b Motor Leg - Right 0[x]  1[]  2[]  3[]  4[]  UN[]    7 Limb Ataxia 0[x]  1[]  2[]  UN[]      8 Sensory 0[x]  1[]  2[]  UN[]      9 Best Language 0[x]  1[]  2[]  3[]      10 Dysarthria 0[]  1[x]  2[]  UN[]      11 Extinct. and Inattention 0[x]  1[]  2[]       TOTAL:       ROS  Comprehensive ROS performed and pertinent positives documented in HPI   Past History   Past Medical History:  Diagnosis Date   Allergy    Anxiety    Arthritis    Asthma    GERD (gastroesophageal reflux disease)    Headache    migraines   PONV (postoperative nausea and vomiting)    Vaginal delivery 06/15/1995    Past Surgical History:  Procedure Laterality Date   BLADDER SUSPENSION N/A 08/30/2014   Procedure: TRANSVAGINAL TAPE (TVT) PROCEDURE;  Surgeon: Krystal Deaner, MD;  Location: [facility];  Service: Gynecology;  Laterality: N/A;   CHOLECYSTECTOMY     CYSTOSCOPY N/A 08/30/2014   Procedure: CYSTOSCOPY;  Surgeon:  Krystal Deaner, MD;  Location: [facility];  Service: Gynecology;  Laterality: N/A;   HYSTEROSCOPY WITH NOVASURE N/A 08/30/2014   Procedure: HYSTEROSCOPY WITH NOVASURE;  Surgeon: Krystal Deaner, MD;  Location: [facility];  Service: Gynecology;  Laterality: N/A;  Dr only needs 1 1/2hrs OR time   INCISION AND DRAINAGE ABSCESS Right 12/11/2020   Procedure: ARTHROSCOPIC INCISION AND DRAINAGE KNEE ABSCESS;  Surgeon: Gerome Charleston, MD;  Location: [facility];  Service: Orthopedics;  Laterality: Right;   IUD REMOVAL N/A 08/30/2014   Procedure: INTRAUTERINE DEVICE (IUD) REMOVAL;  Surgeon: Krystal Deaner, MD;  Location: [facility];  Service: Gynecology;  Laterality: N/A;   KNEE SURGERY     LAPAROSCOPIC TUBAL LIGATION Bilateral 08/30/2014   Procedure: LAPAROSCOPIC TUBAL LIGATION;  Surgeon: Krystal Deaner, MD;  Location: [facility];  Service: Gynecology;  Laterality: Bilateral;   NOSE SURGERY     x 5   thumb  repair Right    uterine ablation      Family History: Family History  Problem Relation Age of Onset   Lung cancer Mother        Stage 4 Lung Cancer   Dementia Father    Dementia Paternal Aunt    Parkinson's disease Paternal Grandmother    Colon cancer Neg Hx    Esophageal cancer Neg Hx    Rectal cancer Neg Hx    Stomach cancer Neg Hx     Social History  reports that she has quit smoking. She has never used smokeless tobacco. She reports current alcohol  use. She reports that she does not use drugs.  No Known Allergies  Medications   Current Facility-Administered Medications:    sodium chloride  flush (NS) 0.9 % injection 3 mL, 3 mL, Intravenous, Once, Lenor Hollering, MD  Current Outpatient Medications:    acetaminophen  (TYLENOL ) 325 MG tablet, Take 2 tablets (650 mg total) by mouth every 6 (six) hours as needed., Disp: 36 tablet, Rfl: 0   acyclovir  (ZOVIRAX ) 200 MG capsule, Take 200 mg by mouth daily., Disp: , Rfl:    albuterol  (PROVENTIL  HFA;VENTOLIN  HFA) 108 (90 BASE) MCG/ACT inhaler, Inhale 1-2 puffs into the lungs every 6 (six) hours as needed for wheezing or shortness of breath., Disp: , Rfl:    amphetamine -dextroamphetamine  (ADDERALL XR) 30 MG 24 hr capsule, Take 30 mg by mouth 2 (two) times daily., Disp: , Rfl:    busPIRone  (BUSPAR ) 30 MG tablet, Take 30 mg by mouth 2 (two) times daily as needed for anxiety., Disp: , Rfl:    butalbital -acetaminophen -caffeine  (FIORICET , ESGIC ) 50-325-40 MG per tablet, Take 1 tablet by mouth 2 (two) times daily as needed for headache or migraine. , Disp: , Rfl:    cetirizine (ZYRTEC) 10 MG tablet, Take 10 mg by mouth daily., Disp: , Rfl:    desvenlafaxine  (PRISTIQ ) 100 MG 24 hr tablet, Take 200 mg by mouth daily., Disp: , Rfl:    diclofenac  (VOLTAREN ) 75 MG EC tablet, Take 1 tablet (75 mg total) by mouth 2 (two) times daily., Disp: 50 tablet, Rfl: 2   Echinacea-Goldenseal (ECHINACEA COMB/GOLDEN SEAL PO), Take 1 tablet by mouth daily., Disp: ,  Rfl:    fluticasone-salmeterol (ADVAIR) 100-50 MCG/ACT AEPB, Inhale 1 puff into the lungs 2 (two) times daily., Disp: , Rfl:    gabapentin  (NEURONTIN ) 300 MG capsule, Take 300 mg by mouth at bedtime., Disp: , Rfl:    Glycerin -Hypromellose-PEG 400 (DRY EYE RELIEF DROPS) 0.2-0.2-1 % SOLN, Place 1 drop into both eyes daily as needed (dry eyes)., Disp: ,  Rfl:    HYDROcodone -acetaminophen  (NORCO) 7.5-325 MG tablet, , Disp: , Rfl:    hydrOXYzine  (ATARAX /VISTARIL ) 50 MG tablet, Take 50-150 mg by mouth every 6 (six) hours as needed for anxiety or itching., Disp: , Rfl:    ibuprofen  (ADVIL ) 600 MG tablet, Take 1 tablet (600 mg total) by mouth every 6 (six) hours as needed., Disp: 30 tablet, Rfl: 0   Melatonin 10 MG TABS, Take 10 mg by mouth at bedtime., Disp: , Rfl:    oxyCODONE -acetaminophen  (PERCOCET) 10-325 MG tablet, Take 1 tablet by mouth 3 (three) times daily., Disp: , Rfl:    SUMAtriptan  (IMITREX ) 100 MG tablet, Take 100 mg by mouth every 2 (two) hours as needed for migraine. May repeat in 2 hours if headache persists or recurs., Disp: , Rfl:    triamcinolone  cream (KENALOG ) 0.5 %, Apply 1 application topically at bedtime as needed (dry skin on hands)., Disp: , Rfl:   Vitals   Vitals:   2024/01/16 1100 2024-01-16 1152 16-Jan-2024 1219 January 16, 2024 1333  BP:  131/85    Pulse:  81 82   Resp:  20 (!) 22   Temp:    97.7 F (36.5 C)  TempSrc:    Oral  SpO2:   100%   Weight: 59.2 kg       Body mass index is 19.84 kg/m.   Physical Exam   Constitutional: Appears well-developed and well-nourished.  Psych: Affect appropriate to situation. Tearful at times Eyes: No scleral injection.  HENT: No OP obstruction.  Head: Normocephalic.  Cardiovascular: Normal rate and regular rhythm.  Respiratory: Effort normal, non-labored breathing.  GI: Soft.  No distension. There is no tenderness.  Skin: WDI.   Neurologic Examination   Neuro: Mental Status: Patient is awake, alert, oriented to person, place,  month, year. Repeatedly states I'm fine but unable to give coherent history of events initially.  Post CT scan she is able to give a coherent history.  Speech pattern is halting and mildly dysarthric. No aphasia Cranial Nerves: II: Visual Fields are full. Pupils are equal, round, and reactive to light.   III,IV, VI: EOMI without ptosis or diploplia.  V: Facial sensation is symmetric to temperature VII: Facial movement is symmetric resting and smiling VIII: Hearing is intact to voice X: Palate elevates symmetrically XI: Shoulder shrug is symmetric. XII: Tongue protrudes midline without atrophy or fasciculations.  Motor: Tone is normal. Bulk is normal. 5/5 strength was present in all four extremities.  Sensory: Sensation is symmetric to light touch and temperature in the arms and legs. No extinction to DSS present.  Cerebellar: FNF and HKS are intact bilaterally   Labs/Imaging/Neurodiagnostic studies   CBC:  Recent Labs  Lab 16-Jan-2024 1146 01/16/24 1152  WBC 6.7  --   NEUTROABS 3.6  --   HGB 13.9 15.0  HCT 41.9 44.0  MCV 104.0*  --   PLT 302  --    Basic Metabolic Panel:  Lab Results  Component Value Date   NA 137 12/11/2020   K 3.5 12/11/2020   CO2 26 12/11/2020   GLUCOSE 116 (H) 12/11/2020   BUN 7 12/11/2020   CREATININE 0.55 12/11/2020   CALCIUM 8.4 (L) 12/11/2020   GFRNONAA >60 12/11/2020   GFRAA >60 05/02/2016   CT Head without contrast(Personally reviewed): 1. Normal brain. 2. ASPECTS is 10.   ASSESSMENT   Anetria Harwick Tallarico is a 53 y.o. female hx of migraines, shoulder pain, asthma, GERD, and arthritis presenting with right sided weakness, abnormal  gait, dizziness, and dysarthria. She was at the church thrift shopping and then spoke with her husband on the phone who stated that her speech was normal..  BP 131/85, glucose 83. She reports a headache over the last couple of days and did she states that she did take her sumatriptan  this morning. Husband reports  episodes of multiple days of not eating with similar presentation in the past.  Reportedly she has used her sumatriptan  on multiple days this week.   RECOMMENDATIONS  - Nutritional and encephalopathy labs ordered  - TSH, HIV, RPR  - B1, B3, B12, Folate, MMA, Mag, phosphorus  - MRI if she does not return to baseline - Migraine cocktail ______________________________________________________________________    Bonney Jorene Last, NP [facility]  Attending Neurologist's note:  I personally saw this patient, gathering history, performing a full neurologic examination, reviewing relevant labs, personally reviewing relevant imaging including head CT, and formulated the assessment and plan, adding the note above for completeness and clarity to accurately reflect my thoughts  Presented as a code stroke, for which she was emergently evaluated.  Exam and history ultimately not felt to be consistent with stroke; NIH of 1 for dysarthria only on my examination as documented above. Given that she has had multiple prior similar episodes, trial of supportive care at first and workup for toxic/metabolic causes is appropriate.  No disabling symptoms at this time but would obtain further CNS imaging if she is not returning to baseline  Lola Jernigan MD-PhD [facility] [phone] Available 7 AM to 7 PM, outside these hours please contact Neurologist on call listed on AMION   CRITICAL CARE Performed by: Lola LITTIE Jernigan   Total critical care time: 35 minutes  Critical care time was exclusive of separately billable procedures and treating other patients.  Critical care was necessary to treat or prevent imminent or life-threatening deterioration -- .  emergent evaluation for consideration of thrombectomy or thrombolytic  Critical care was time spent personally by me on the following activities: development of treatment plan with patient and/or surrogate as well as nursing,  discussions with consultants, evaluation of patient's response to treatment, examination of patient, obtaining history from patient or surrogate, ordering and performing treatments and interventions, ordering and review of laboratory studies, ordering and review of radiographic studies, pulse oximetry and re-evaluation of patient's condition.

## 2023-12-20 NOTE — ED Triage Notes (Signed)
 EMS states that pt was on the phone with husband at 0930. She was at a church event shopping when staff noticed she was walking funny and her speech was altered. EMS was then called. EMS noted a right-sided facial droop and slurred speech.

## 2023-12-20 NOTE — ED Notes (Signed)
 CCMD called, pt on monitor

## 2023-12-20 NOTE — ED Provider Notes (Signed)
 [place] EMERGENCY DEPARTMENT AT [facility] Provider Note   CSN: [id number] Arrival date & time: 12/20/23  1145  An emergency department physician performed an initial assessment on this suspected stroke patient at 1219.  Patient presents with: No chief complaint on file.   Natalie Yang is a 53 y.o. female.   Patient is a 53 year old female who presents as a code stroke.  Patient states she was shopping at a thrift store and she tripped over a dressing rack.  She got a little dizzy and the staff noticed that her speech was funny.  EMS noted slurred speech and reports of some right sided facial drooping and activated a code stroke.  On arrival, she does not seem to have any deficits.  She is a little bit slow to talk.  She said that she been feeling fine when she walked into the thrift store.  She has not had any recent illnesses.  She has had a migraine for about 4 days when she says it similar to her prior migrainous type headaches.  No nausea or vomiting.  No fevers.  No cough or cold symptoms.  No numbness or weakness to her extremities.  She feels like her speech is normal and she is adamant that she is not having a stroke.  Her husband also helps provide history.  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  He states that sometimes she gets like this when she has not been eating much.  Patient denies any thoughts of self-harm.  No suicidal or homicidal ideations.  No depressed thoughts.       Prior to Admission medications   Medication Sig Start Date End Date Taking? Authorizing Provider  acetaminophen  (TYLENOL ) 325 MG tablet Take 2 tablets (650 mg total) by mouth every 6 (six) hours as needed. 06/09/21   Elnor Jayson LABOR, DO  acyclovir  (ZOVIRAX ) 200 MG capsule Take 200 mg by mouth daily.    [provider]  albuterol  (PROVENTIL  HFA;VENTOLIN  HFA) 108 (90 BASE) MCG/ACT inhaler Inhale 1-2 puffs into the lungs every  6 (six) hours as needed for wheezing or shortness of breath.    [provider]  amphetamine -dextroamphetamine  (ADDERALL XR) 30 MG 24 hr capsule Take 30 mg by mouth 2 (two) times daily. 12/05/20   [provider]  busPIRone  (BUSPAR ) 30 MG tablet Take 30 mg by mouth 2 (two) times daily as needed for anxiety. 12/05/20   [provider]  butalbital -acetaminophen -caffeine  (FIORICET , ESGIC ) 50-325-40 MG per tablet Take 1 tablet by mouth 2 (two) times daily as needed for headache or migraine.     [provider]  cetirizine (ZYRTEC) 10 MG tablet Take 10 mg by mouth daily.    [provider]  desvenlafaxine  (PRISTIQ ) 100 MG 24 hr tablet Take 200 mg by mouth daily. 08/01/20   [provider]  diclofenac  (VOLTAREN ) 75 MG EC tablet Take 1 tablet (75 mg total) by mouth 2 (two) times daily. 04/14/22   Regal, [place] RAMAN, DPM  Echinacea-Goldenseal (ECHINACEA COMB/GOLDEN SEAL PO) Take 1 tablet by mouth daily.    [provider]  fluticasone-salmeterol (ADVAIR) 100-50 MCG/ACT AEPB Inhale 1 puff into the lungs 2 (two) times daily. 08/16/18   [provider]  gabapentin  (NEURONTIN ) 300 MG capsule Take 300 mg by mouth at bedtime. 10/06/20   [provider]  Glycerin -Hypromellose-PEG 400 (DRY EYE RELIEF DROPS) 0.2-0.2-1 % SOLN Place 1 drop into both eyes  daily as needed (dry eyes).    [provider]  HYDROcodone -acetaminophen  (NORCO) 7.5-325 MG tablet     [provider]  hydrOXYzine  (ATARAX /VISTARIL ) 50 MG tablet Take 50-150 mg by mouth every 6 (six) hours as needed for anxiety or itching. 11/25/20   [provider]  ibuprofen  (ADVIL ) 600 MG tablet Take 1 tablet (600 mg total) by mouth every 6 (six) hours as needed. 06/09/21   Elnor Jayson LABOR, DO  Melatonin 10 MG TABS Take 10 mg by mouth at bedtime.    [provider]  oxyCODONE -acetaminophen  (PERCOCET) 10-325 MG tablet Take 1 tablet by mouth 3 (three) times daily.  01/16/20   [provider]  SUMAtriptan  (IMITREX ) 100 MG tablet Take 100 mg by mouth every 2 (two) hours as needed for migraine. May repeat in 2 hours if headache persists or recurs.    [provider]  triamcinolone  cream (KENALOG ) 0.5 % Apply 1 application topically at bedtime as needed (dry skin on hands).    [provider]    Allergies: Patient has no known allergies.    Review of Systems  Constitutional:  Positive for fatigue. Negative for chills, diaphoresis and fever.  HENT:  Negative for congestion, rhinorrhea and sneezing.   Eyes: Negative.   Respiratory:  Negative for cough, chest tightness and shortness of breath.   Cardiovascular:  Negative for chest pain and leg swelling.  Gastrointestinal:  Negative for abdominal pain, blood in stool, diarrhea, nausea and vomiting.  Genitourinary:  Negative for difficulty urinating, flank pain, frequency and hematuria.  Musculoskeletal:  Negative for arthralgias and back pain.  Skin:  Negative for rash.  Neurological:  Positive for light-headedness. Negative for speech difficulty, weakness, numbness and headaches.  Psychiatric/Behavioral:  Negative for dysphoric mood and suicidal ideas.     Updated Vital Signs BP 131/85   Pulse 82   Temp 97.7 F (36.5 C) (Oral)   Resp (!) 22   Wt 59.2 kg   SpO2 100%   BMI 19.84 kg/m   Physical Exam Constitutional:      Appearance: She is well-developed.  HENT:     Head: Normocephalic and atraumatic.  Eyes:     Pupils: Pupils are equal, round, and reactive to light.  Cardiovascular:     Rate and Rhythm: Normal rate and regular rhythm.     Heart sounds: Normal heart sounds.  Pulmonary:     Effort: Pulmonary effort is normal. No respiratory distress.     Breath sounds: Normal breath sounds. No wheezing or rales.  Chest:     Chest wall: No tenderness.  Abdominal:     General: Bowel sounds are normal.     Palpations: Abdomen is soft.     Tenderness: There is no  abdominal tenderness. There is no guarding or rebound.  Musculoskeletal:        General: Normal range of motion.     Cervical back: Normal range of motion and neck supple.  Lymphadenopathy:     Cervical: No cervical adenopathy.  Skin:    General: Skin is warm and dry.     Findings: No rash.  Neurological:     Mental Status: She is alert and oriented to person, place, and time.     Comments: Motor 5/5 all extremities Sensation grossly intact to LT all extremities Finger to Nose intact, no pronator drift CN II-XII grossly intact       (all labs ordered are listed, but only abnormal results are displayed) Labs Reviewed  CBC - Abnormal; Notable for the following components:      Result Value   MCV 104.0 (*)    MCH 34.5 (*)    All other components within normal limits  COMPREHENSIVE METABOLIC PANEL WITH GFR - Abnormal; Notable for the following components:   CO2 20 (*)    All other components within normal limits  I-STAT CHEM 8, ED - Abnormal; Notable for the following components:   TCO2 20 (*)    All other components within normal limits  PROTIME-INR  APTT  DIFFERENTIAL  ETHANOL  VITAMIN B12  FOLATE  TSH  HIV ANTIBODY (ROUTINE TESTING W REFLEX)  HEMOGLOBIN A1C  MAGNESIUM   PHOSPHORUS  METHYLMALONIC ACID, SERUM  MISC [facility] TEST (SEND OUT)  RPR  URINALYSIS, W/ REFLEX TO CULTURE (INFECTION SUSPECTED)  CBG MONITORING, ED    EKG: None  Radiology: CT HEAD CODE STROKE WO CONTRAST Result Date: 12/20/2023 CLINICAL DATA:  Code stroke.  Acute neurological deficit. EXAM: CT HEAD WITHOUT CONTRAST TECHNIQUE: Contiguous axial images were obtained from the base of the skull through the vertex without intravenous contrast. RADIATION DOSE REDUCTION: This exam was performed according to the departmental dose-optimization program which includes automated exposure control, adjustment of the mA and/or kV according to patient size and/or use of iterative reconstruction technique.  COMPARISON:  None Available. FINDINGS: Brain: Normal brain. No evidence of hemorrhage, mass, cortical infarct or hydrocephalus. Vascular: No hyperdense vessel or unexpected calcification. Skull: Intact and unremarkable. Sinuses/Orbits: Mucosal disease within the maxillary sinuses bilaterally. Normal orbits. Other: None. ASPECTS [facility] Stroke Program Early CT Score) - Ganglionic level infarction (caudate, lentiform nuclei, internal capsule, insula, M1-M3 cortex): 7. - Supraganglionic infarction (M4-M6 cortex): 3. Total score (0-10 with 10 being normal): 10. IMPRESSION: 1. Normal brain. 2. ASPECTS is 10. 3. These results were communicated be Amion paging service at the time of interpretation on 12/20/2023 at 12:01 pm to Dr. Jerrie. Electronically Signed   By: Evalene Coho M.D.   On: 12/20/2023 12:04     Procedures   Medications Ordered in the ED  sodium chloride  flush (NS) 0.9 % injection 3 mL (has no administration in time range)                                    Medical Decision Making Amount and/or Complexity of Data Reviewed Labs: ordered. Radiology: ordered.   This patient presents to the ED for concern of dizziness, facial drooping, this involves an extensive number of treatment options, and is a complaint that carries with it a high risk of complications and morbidity.  I considered the following differential and admission for this acute, potentially life threatening condition.  The differential diagnosis includes stroke, vertigo, dehydration, nutritional abnormality, electrolyte abnormality  MDM:    Patient is a 53 year old who presents as a code stroke.  She has a little bit of slurred speech although she says her speech is normal for her.  She otherwise does not have any focal neurologic deficits.  She has been evaluated by neurology who did not feel that this is consistent with a stroke.  Head CT does not show any acute abnormality.  Electrolytes are nonconcerning.  Neurology  was debating about doing an MRI.  However patient notified the RN that she was ready to leave.  She did not express any depression or SI.  She left prior to my being able to go back and reassess her.  (  Labs, imaging, consults)  Labs: I Ordered, and personally interpreted labs.  The pertinent results include: Electrolytes nonconcerning  Imaging Studies ordered: I ordered imaging studies including head CT negative I independently visualized and interpreted imaging. I agree with the radiologist interpretation  Additional history obtained from chart.  External records from outside source obtained and reviewed including prior notes  Cardiac Monitoring: The patient was maintained on a cardiac monitor.  If on the cardiac monitor, I personally viewed and interpreted the cardiac monitored which showed an underlying rhythm of: Sinus rhythm  Reevaluation: After the interventions noted above, I reevaluated the patient and found that they have :improved  Social Determinants of Health:    Disposition: Left AMA  Co morbidities that complicate the patient evaluation  Past Medical History:  Diagnosis Date   Allergy    Anxiety    Arthritis    Asthma    GERD (gastroesophageal reflux disease)    Headache    migraines   PONV (postoperative nausea and vomiting)    Vaginal delivery 06/15/1995     Medicines Meds ordered this encounter  Medications   sodium chloride  flush (NS) 0.9 % injection 3 mL    I have reviewed the patients home medicines and have made adjustments as needed  Problem List / ED Course: Problem List Items Addressed This Visit   None            Final diagnoses:  None    ED Discharge Orders     None          Lenor Hollering, MD 12/20/23 1608

## 2023-12-20 NOTE — ED Notes (Signed)
 Educated the pt on the risk of leaving AMA, pt is A&O x4. And states that she understands the risk

## 2023-12-20 NOTE — Code Documentation (Signed)
 Stroke Response Nurse Documentation Code Documentation  Natalie Yang is a 53 y.o. female arriving to [facility]  via [facility] on 12/20/2023 with past medical hx of migraines, anxiety, right arm weakness 2022, and GERD. On No antithrombotic. Code stroke was activated by EMS.   Patient at a [employer] where she was LKW at 0930 when speaking on phone with her husband and staff noted her speech and gait were altered.   Stroke team at the bedside on patient arrival. Labs drawn. Reports headache behind eyes that started days ago and she took sumatriptan  this morning. Patient to CT with team. NIHSS 0, see documentation for details and code stroke times. Patient with slow speech only. The following imaging was completed:  CT Head. Patient is not a candidate for IV Thrombolytic due to stroke not suspected. Patient is not a candidate for IR due to no LVO. Passed swallow screen.   Care Plan: MRI, Q30 min VS and NIHSS until 1400 then Q2.  Bedside handoff with ED RN.    Madelin Manila Stroke Response RN

## 2023-12-20 NOTE — Progress Notes (Signed)
CBG 83 

## 2023-12-21 LAB — CBG MONITORING, ED: Glucose-Capillary: 83 mg/dL (ref 70–99)

## 2023-12-21 LAB — RPR: RPR Ser Ql: NONREACTIVE

## 2023-12-22 LAB — METHYLMALONIC ACID, SERUM: Methylmalonic Acid, Quantitative: 84 nmol/L (ref 0–378)
